# Patient Record
Sex: Female | Born: 1985 | Race: White | Hispanic: No | Marital: Married | Smoking: Never smoker
Health system: Southern US, Community
[De-identification: ages and names within clinical notes are randomized; demographics above are authoritative.]

## PROBLEM LIST (undated history)

## (undated) DIAGNOSIS — G5602 Carpal tunnel syndrome, left upper limb: Secondary | ICD-10-CM

## (undated) DIAGNOSIS — R112 Nausea with vomiting, unspecified: Secondary | ICD-10-CM

## (undated) DIAGNOSIS — K589 Irritable bowel syndrome without diarrhea: Secondary | ICD-10-CM

## (undated) DIAGNOSIS — Z9889 Other specified postprocedural states: Secondary | ICD-10-CM

## (undated) DIAGNOSIS — G4733 Obstructive sleep apnea (adult) (pediatric): Secondary | ICD-10-CM

## (undated) DIAGNOSIS — E282 Polycystic ovarian syndrome: Secondary | ICD-10-CM

## (undated) DIAGNOSIS — M2669 Other specified disorders of temporomandibular joint: Secondary | ICD-10-CM

## (undated) DIAGNOSIS — E538 Deficiency of other specified B group vitamins: Secondary | ICD-10-CM

## (undated) HISTORY — PX: TONSILLECTOMY: SUR1361

## (undated) HISTORY — PX: BREAST LUMPECTOMY: SHX2

## (undated) HISTORY — PX: CYST REMOVAL HAND: SHX6279

---

## 2013-09-04 ENCOUNTER — Ambulatory Visit: Payer: Self-pay | Admitting: Gastroenterology

## 2013-09-07 LAB — PATHOLOGY REPORT

## 2014-01-22 ENCOUNTER — Other Ambulatory Visit: Payer: Self-pay | Admitting: Orthopedic Surgery

## 2014-01-29 ENCOUNTER — Encounter (HOSPITAL_BASED_OUTPATIENT_CLINIC_OR_DEPARTMENT_OTHER): Payer: Self-pay | Admitting: *Deleted

## 2014-02-01 NOTE — Progress Notes (Signed)
Pt in for anesthesia consult - Dr. Sampson GoonFitzgerald examined airway - ok for surgery

## 2014-02-04 ENCOUNTER — Encounter (HOSPITAL_BASED_OUTPATIENT_CLINIC_OR_DEPARTMENT_OTHER): Payer: Self-pay | Admitting: Certified Registered"

## 2014-02-04 ENCOUNTER — Ambulatory Visit (HOSPITAL_BASED_OUTPATIENT_CLINIC_OR_DEPARTMENT_OTHER): Payer: 59 | Admitting: Certified Registered"

## 2014-02-04 ENCOUNTER — Encounter (HOSPITAL_BASED_OUTPATIENT_CLINIC_OR_DEPARTMENT_OTHER)
Admission: RE | Disposition: A | Discharge status: Home / Self Care | Payer: Self-pay | Source: Ambulatory Visit | Attending: Orthopedic Surgery

## 2014-02-04 ENCOUNTER — Ambulatory Visit (HOSPITAL_BASED_OUTPATIENT_CLINIC_OR_DEPARTMENT_OTHER)
Admission: RE | Admit: 2014-02-04 | Discharge: 2014-02-04 | Disposition: A | Discharge status: Home / Self Care | Payer: 59 | Source: Ambulatory Visit | Attending: Orthopedic Surgery | Admitting: Orthopedic Surgery

## 2014-02-04 DIAGNOSIS — G5601 Carpal tunnel syndrome, right upper limb: Secondary | ICD-10-CM | POA: Insufficient documentation

## 2014-02-04 DIAGNOSIS — K589 Irritable bowel syndrome without diarrhea: Secondary | ICD-10-CM | POA: Insufficient documentation

## 2014-02-04 HISTORY — DX: Other specified disorders of temporomandibular joint: M26.69

## 2014-02-04 HISTORY — DX: Nausea with vomiting, unspecified: R11.2

## 2014-02-04 HISTORY — DX: Other specified postprocedural states: Z98.890

## 2014-02-04 HISTORY — PX: CARPAL TUNNEL RELEASE: SHX101

## 2014-02-04 HISTORY — DX: Irritable bowel syndrome, unspecified: K58.9

## 2014-02-04 LAB — POCT HEMOGLOBIN-HEMACUE: Hemoglobin: 13.5 g/dL (ref 12.0–15.0)

## 2014-02-04 SURGERY — CARPAL TUNNEL RELEASE
Anesthesia: General | Site: Wrist | Laterality: Right

## 2014-02-04 MED ORDER — SCOPOLAMINE 1 MG/3DAYS TD PT72
MEDICATED_PATCH | TRANSDERMAL | Status: AC
  Filled 2014-02-04: qty 1

## 2014-02-04 MED ORDER — LIDOCAINE HCL (CARDIAC) 20 MG/ML IV SOLN
INTRAVENOUS | Status: DC | PRN
  Administered 2014-02-04: 60 mg via INTRAVENOUS

## 2014-02-04 MED ORDER — LACTATED RINGERS IV SOLN
INTRAVENOUS | Status: DC
  Administered 2014-02-04 (×2): via INTRAVENOUS

## 2014-02-04 MED ORDER — MIDAZOLAM HCL 2 MG/ML PO SYRP: 12.0000 mg | ORAL_SOLUTION | Freq: Once | ORAL | Status: DC | PRN

## 2014-02-04 MED ORDER — FENTANYL CITRATE 0.05 MG/ML IJ SOLN
INTRAMUSCULAR | Status: DC | PRN
  Administered 2014-02-04: 100 ug via INTRAVENOUS
  Administered 2014-02-04 (×2): 25 ug via INTRAVENOUS
  Administered 2014-02-04: 50 ug via INTRAVENOUS

## 2014-02-04 MED ORDER — CHLORHEXIDINE GLUCONATE 4 % EX LIQD: 60.0000 mL | Freq: Once | CUTANEOUS | Status: DC

## 2014-02-04 MED ORDER — FENTANYL CITRATE 0.05 MG/ML IJ SOLN
INTRAMUSCULAR | Status: AC
  Filled 2014-02-04: qty 4

## 2014-02-04 MED ORDER — SCOPOLAMINE 1 MG/3DAYS TD PT72
1.0000 | MEDICATED_PATCH | TRANSDERMAL | Status: DC
Start: 2014-02-04 — End: 2014-02-04
  Administered 2014-02-04: 1.5 mg via TRANSDERMAL

## 2014-02-04 MED ORDER — MIDAZOLAM HCL 5 MG/5ML IJ SOLN
INTRAMUSCULAR | Status: DC | PRN
  Administered 2014-02-04: 2 mg via INTRAVENOUS

## 2014-02-04 MED ORDER — DEXAMETHASONE SODIUM PHOSPHATE 10 MG/ML IJ SOLN
INTRAMUSCULAR | Status: DC | PRN
  Administered 2014-02-04: 10 mg via INTRAVENOUS

## 2014-02-04 MED ORDER — HYDROCODONE-ACETAMINOPHEN 5-325 MG PO TABS: ORAL_TABLET | ORAL | Status: DC

## 2014-02-04 MED ORDER — ONDANSETRON HCL 4 MG/2ML IJ SOLN: 4.0000 mg | Freq: Once | INTRAMUSCULAR | Status: DC | PRN

## 2014-02-04 MED ORDER — HYDROMORPHONE HCL 1 MG/ML IJ SOLN
0.2500 mg | INTRAMUSCULAR | Status: DC | PRN
  Administered 2014-02-04: 0.5 mg via INTRAVENOUS

## 2014-02-04 MED ORDER — PROPOFOL 10 MG/ML IV BOLUS
INTRAVENOUS | Status: DC | PRN
  Administered 2014-02-04: 200 mg via INTRAVENOUS

## 2014-02-04 MED ORDER — ONDANSETRON HCL 4 MG/2ML IJ SOLN
INTRAMUSCULAR | Status: DC | PRN
  Administered 2014-02-04: 4 mg via INTRAVENOUS

## 2014-02-04 MED ORDER — CEFAZOLIN SODIUM-DEXTROSE 2-3 GM-% IV SOLR
INTRAVENOUS | Status: AC
  Filled 2014-02-04: qty 50

## 2014-02-04 MED ORDER — BUPIVACAINE HCL (PF) 0.25 % IJ SOLN
INTRAMUSCULAR | Status: AC
  Filled 2014-02-04: qty 30

## 2014-02-04 MED ORDER — OXYCODONE HCL 5 MG/5ML PO SOLN: 5.0000 mg | Freq: Once | ORAL | Status: DC | PRN

## 2014-02-04 MED ORDER — MIDAZOLAM HCL 2 MG/2ML IJ SOLN
INTRAMUSCULAR | Status: AC
  Filled 2014-02-04: qty 2

## 2014-02-04 MED ORDER — CEFAZOLIN SODIUM 1-5 GM-% IV SOLN
INTRAVENOUS | Status: AC
  Filled 2014-02-04: qty 50

## 2014-02-04 MED ORDER — HYDROMORPHONE HCL 1 MG/ML IJ SOLN
INTRAMUSCULAR | Status: AC
  Filled 2014-02-04: qty 1

## 2014-02-04 MED ORDER — BUPIVACAINE HCL (PF) 0.25 % IJ SOLN
INTRAMUSCULAR | Status: DC | PRN
  Administered 2014-02-04: 9 mL

## 2014-02-04 MED ORDER — CEFAZOLIN SODIUM-DEXTROSE 2-3 GM-% IV SOLR
2.0000 g | INTRAVENOUS | Status: AC
  Administered 2014-02-04: 2 g via INTRAVENOUS

## 2014-02-04 MED ORDER — FENTANYL CITRATE 0.05 MG/ML IJ SOLN: 50.0000 ug | INTRAMUSCULAR | Status: DC | PRN

## 2014-02-04 MED ORDER — OXYCODONE HCL 5 MG PO TABS: 5.0000 mg | ORAL_TABLET | Freq: Once | ORAL | Status: DC | PRN

## 2014-02-04 MED ORDER — MIDAZOLAM HCL 2 MG/2ML IJ SOLN: 1.0000 mg | INTRAMUSCULAR | Status: DC | PRN

## 2014-02-04 SURGICAL SUPPLY — 38 items
BANDAGE ELASTIC 3 VELCRO ST LF (GAUZE/BANDAGES/DRESSINGS) ×3 IMPLANT
BLADE MINI RND TIP GREEN BEAV (BLADE) IMPLANT
BLADE SURG 15 STRL LF DISP TIS (BLADE) ×2 IMPLANT
BLADE SURG 15 STRL SS (BLADE) ×4
BNDG ESMARK 4X9 LF (GAUZE/BANDAGES/DRESSINGS) ×3 IMPLANT
BNDG GAUZE ELAST 4 BULKY (GAUZE/BANDAGES/DRESSINGS) ×3 IMPLANT
CHLORAPREP W/TINT 26ML (MISCELLANEOUS) ×3 IMPLANT
CORDS BIPOLAR (ELECTRODE) ×3 IMPLANT
COVER BACK TABLE 60X90IN (DRAPES) ×3 IMPLANT
COVER MAYO STAND STRL (DRAPES) ×3 IMPLANT
CUFF TOURNIQUET SINGLE 18IN (TOURNIQUET CUFF) IMPLANT
CUFF TOURNIQUET SINGLE 24IN (TOURNIQUET CUFF) ×3 IMPLANT
DRAPE EXTREMITY T 121X128X90 (DRAPE) ×3 IMPLANT
DRAPE SURG 17X23 STRL (DRAPES) ×3 IMPLANT
DRSG PAD ABDOMINAL 8X10 ST (GAUZE/BANDAGES/DRESSINGS) ×3 IMPLANT
GAUZE SPONGE 4X4 12PLY STRL (GAUZE/BANDAGES/DRESSINGS) ×3 IMPLANT
GAUZE XEROFORM 1X8 LF (GAUZE/BANDAGES/DRESSINGS) ×3 IMPLANT
GLOVE BIO SURGEON STRL SZ7.5 (GLOVE) ×3 IMPLANT
GLOVE BIOGEL PI IND STRL 6.5 (GLOVE) ×1 IMPLANT
GLOVE BIOGEL PI IND STRL 8 (GLOVE) ×1 IMPLANT
GLOVE BIOGEL PI INDICATOR 6.5 (GLOVE) ×2
GLOVE BIOGEL PI INDICATOR 8 (GLOVE) ×2
GLOVE ECLIPSE 6.5 STRL STRAW (GLOVE) ×3 IMPLANT
GLOVE EXAM NITRILE EXT CUFF MD (GLOVE) ×3 IMPLANT
GOWN STRL REUS W/ TWL LRG LVL3 (GOWN DISPOSABLE) ×1 IMPLANT
GOWN STRL REUS W/TWL LRG LVL3 (GOWN DISPOSABLE) ×2
GOWN STRL REUS W/TWL XL LVL3 (GOWN DISPOSABLE) ×3 IMPLANT
NEEDLE HYPO 25X1 1.5 SAFETY (NEEDLE) ×3 IMPLANT
NS IRRIG 1000ML POUR BTL (IV SOLUTION) ×3 IMPLANT
PACK BASIN DAY SURGERY FS (CUSTOM PROCEDURE TRAY) ×3 IMPLANT
PADDING CAST ABS 4INX4YD NS (CAST SUPPLIES) ×2
PADDING CAST ABS COTTON 4X4 ST (CAST SUPPLIES) ×1 IMPLANT
STOCKINETTE 4X48 STRL (DRAPES) ×3 IMPLANT
SUT ETHILON 4 0 PS 2 18 (SUTURE) ×3 IMPLANT
SYR BULB 3OZ (MISCELLANEOUS) ×3 IMPLANT
SYR CONTROL 10ML LL (SYRINGE) ×3 IMPLANT
TOWEL OR 17X24 6PK STRL BLUE (TOWEL DISPOSABLE) ×3 IMPLANT
UNDERPAD 30X30 INCONTINENT (UNDERPADS AND DIAPERS) ×3 IMPLANT

## 2014-02-04 NOTE — Op Note (Signed)
Misty West:  West, Misty West              ACCOUNT NO.:  [id number]  MEDICAL RECORD NO.:  [id number]  LOCATION:                                 FACILITY:  PHYSICIAN:  Betha LoaKevin Chanceler Pullin, MD             DATE OF BIRTH:  DATE OF PROCEDURE:  02/04/2014 DATE OF DISCHARGE:                              OPERATIVE REPORT   PREOPERATIVE DIAGNOSIS:  Right carpal tunnel syndrome.  POSTOPERATIVE DIAGNOSIS:  Right carpal tunnel syndrome.  PROCEDURE:  Right carpal tunnel release.  SURGEON:  Betha LoaKevin Mailani Degroote, MD  ASSISTANT:  None.  ANESTHESIA:  General.  IV FLUIDS:  Per anesthesia flow sheet.  ESTIMATED BLOOD LOSS:  Minimal.  COMPLICATIONS:  None.  SPECIMENS:  None.  TOURNIQUET TIME:  24 minutes.  DISPOSITION:  Stable to PACU.  INDICATIONS:  Ms. Misty MechBittner is a 28 year old female, who has had pins and needle sensation in the digits.  It is bothersome to her.  It wakes her at night, causing her to shake her hand again at relief.  She has positive nerve conduction studies.  She wished to have a right carpal tunnel release for management of symptoms.  Risks, benefits, and alternatives of the surgery were discussed including risk of blood loss, infection, damage to nerves, vessels, tendons, ligaments, bone; failure of surgery; need for additional surgery, complications with wound healing, continued pain, recurrent carpal tunnel syndrome and damage to motor branch.  She voiced understanding of these risks and elected to proceed.  OPERATIVE COURSE:  After being identified preoperatively by myself, the patient and I agreed upon procedure and site of procedure.  Surgical site was marked.  The risks, benefits, and alternatives of surgery were reviewed and she wished to proceed.  Surgical consent had been signed. She was given IV Ancef as preoperative antibiotic prophylaxis.  She was transported to the operating room and placed on the operating room table in a supine position with the right upper extremity on  arm board. General anesthesia was induced by Anesthesiology.  Right upper extremity was prepped and draped in normal sterile orthopedic fashion.  A surgical pause was performed between surgeons, anesthesia, and operating room staff, and all were in agreement as to the patient, procedure, and site of procedure.  Tourniquet at the proximal aspect of the extremity was inflated to 250 mmHg after exsanguination of the limb with Esmarch bandage.  Incision was made over the transverse carpal ligament and carried into subcutaneous tissues by spreading technique.  Bipolar electrocautery was used to obtain hemostasis.  The palmar fascia was sharply incised.  The transverse carpal ligament was identified and sharply incised.  It was incised distally first.  Care was taken to ensure complete decompression distally.  I was then incised proximally. Scissors were used to split the distal aspect of the volar antebrachial fascia.  The finger was placed into the wound to ensure complete decompression it was the case.  The nerve was inspected.  It was hyperemic.  There was what appeared to be a persistent median artery. The motor branch was identified and was intact.  The wound was copiously irrigated with sterile saline.  It was then closed with 4-0  nylon in a horizontal mattress fashion.  It was injected with 9 mL of 0.25% plain Marcaine to aid in postoperative analgesia.  It was then dressed with sterile Xeroform, 4x4s, and ABD and wrapped with Kerlix and Ace bandage. Tourniquet was deflated at 24 minutes.  Fingertips were pink with brisk capillary refill after deflation of the tourniquet.  Operative drapes were broken down.  The patient was awoken from anesthesia safely.  She was transferred back to stretcher and taken to PACU in stable condition. I will see her back in the office in 1 week for postoperative followup. I will give her Norco 5/325, 1-2 p.o. q.6 hours p.r.n. pain,  dispensed #30.     Betha LoaKevin Stanisha Lorenz, MD     KK/MEDQ  D:  02/04/2014  T:  02/04/2014  Job:  [id number]

## 2014-02-04 NOTE — H&P (Signed)
  Misty West is an 28 y.o. female.   Chief Complaint: right carpal tunnel syndrome HPI: 28 yo lhd female with numbness and tingling in bilateral hands.  She has nocturnal symptoms that wake her and cause her to shake her hand to get relief.  Positive nerve conduction studies.  She wishes to have a carpal tunnel release for management of symptoms.  Past Medical History  Diagnosis Date  . PONV (postoperative nausea and vomiting)   . Irritable bowel syndrome (IBS)     no current med.  . Carpal tunnel syndrome of right wrist 01/2014  . TMJ locking     Past Surgical History  Procedure Laterality Date  . Cesarean section    . Breast lumpectomy Right   . Tonsillectomy    . Cyst removal hand Right     History reviewed. No pertinent family history. Social History:  reports that she has never smoked. She has never used smokeless tobacco. She reports that she drinks alcohol. She reports that she does not use illicit drugs.  Allergies: No Known Allergies  Medications Prior to Admission  Medication Sig Dispense Refill  . etonogestrel (NEXPLANON) 68 MG IMPL implant 1 each by Subdermal route once.      Results for orders placed or performed during the hospital encounter of 02/04/14 (from the past 48 hour(s))  Hemoglobin-hemacue, POC     Status: None   Collection Time: 02/04/14  9:10 AM  Result Value Ref Range   Hemoglobin 13.5 12.0 - 15.0 g/dL    No results found.   A comprehensive review of systems was negative except for: Eyes: positive for contacts/glasses  Blood pressure 133/63, pulse 100, temperature 98.2 F (36.8 C), temperature source Oral, resp. rate 20, height 5\' 8"  (1.727 m), weight 135.626 kg (299 lb), SpO2 100 %.  General appearance: alert, cooperative and appears stated age Head: Normocephalic, without obvious abnormality, atraumatic Neck: supple, symmetrical, trachea midline Resp: clear to auscultation bilaterally Cardio: regular rate and rhythm GI: non  tender Extremities: intact sensation and capillary refill in fingertips.  +epl/fpl/io.  no wounds Pulses: 2+ and symmetric Skin: Skin color, texture, turgor normal. No rashes or lesions Neurologic: Grossly normal Incision/Wound: none  Assessment/Plan Right carpal tunnel syndrome.  Non operative and operative treatment options were discussed with the patient and patient wishes to proceed with operative treatment. Risks, benefits, and alternatives of surgery were discussed and the patient agrees with the plan of care.   Misty West R 02/04/2014, 10:02 AM

## 2014-02-04 NOTE — Anesthesia Preprocedure Evaluation (Addendum)
Anesthesia Evaluation  Patient identified by MRN, date of birth, ID band Patient awake    Reviewed: Allergy & Precautions, H&P , NPO status   History of Anesthesia Complications (+) PONV  Airway Mallampati: II  TM Distance: >3 FB Neck ROM: Full    Dental  (+) Teeth Intact, Dental Advisory Given   Pulmonary  breath sounds clear to auscultation        Cardiovascular Rhythm:Regular Rate:Normal     Neuro/Psych  Neuromuscular disease    GI/Hepatic   Endo/Other  Morbid obesity  Renal/GU      Musculoskeletal   Abdominal   Peds  Hematology   Anesthesia Other Findings   Reproductive/Obstetrics                            Anesthesia Physical Anesthesia Plan  ASA: II  Anesthesia Plan: General   Post-op Pain Management:    Induction: Intravenous  Airway Management Planned: LMA  Additional Equipment:   Intra-op Plan:   Post-operative Plan: Extubation in OR  Informed Consent: I have reviewed the patients History and Physical, chart, labs and discussed the procedure including the risks, benefits and alternatives for the proposed anesthesia with the patient or authorized representative who has indicated his/her understanding and acceptance.   Dental advisory given  Plan Discussed with:   Anesthesia Plan Comments: (Pt desires general. Poor experiences in the past with sedation.)        Anesthesia Quick Evaluation

## 2014-02-04 NOTE — Anesthesia Procedure Notes (Signed)
Procedure Name: LMA Insertion Date/Time: 02/04/2014 11:01 AM Performed by: Ellington Cornia Pre-anesthesia Checklist: Patient identified, Emergency Drugs available, Suction available and Patient being monitored Patient Re-evaluated:Patient Re-evaluated prior to inductionOxygen Delivery Method: Circle System Utilized Preoxygenation: Pre-oxygenation with 100% oxygen Intubation Type: IV induction Ventilation: Mask ventilation without difficulty LMA: LMA inserted LMA Size: 4.0 Number of attempts: 1 Airway Equipment and Method: bite block Placement Confirmation: positive ETCO2 Tube secured with: Tape Dental Injury: Teeth and Oropharynx as per pre-operative assessment

## 2014-02-04 NOTE — Op Note (Signed)
[id number] 

## 2014-02-04 NOTE — Brief Op Note (Signed)
02/04/2014  11:39 AM  PATIENT:  Misty West  28 y.o. female  PRE-OPERATIVE DIAGNOSIS:  right carpal tunnel syndrome  POST-OPERATIVE DIAGNOSIS:  right carpal tunnel syndrome  PROCEDURE:  Procedure(s): RIGHT CARPAL TUNNEL RELEASE (Right)  SURGEON:  Surgeon(s) and Role:    * Betha LoaKevin Valree Feild, MD - Primary  PHYSICIAN ASSISTANT:   ASSISTANTS: none   ANESTHESIA:   general  EBL:  Total I/O In: 1000 [I.V.:1000] Out: -   BLOOD ADMINISTERED:none  DRAINS: none   LOCAL MEDICATIONS USED:  MARCAINE     SPECIMEN:  No Specimen  DISPOSITION OF SPECIMEN:  N/A  COUNTS:  YES  TOURNIQUET:   Total Tourniquet Time Documented: Upper Arm (Right) - 24 minutes Total: Upper Arm (Right) - 24 minutes   DICTATION: .Other Dictation: Dictation Number [id number]  PLAN OF CARE: Discharge to home after PACU  PATIENT DISPOSITION:  PACU - hemodynamically stable.

## 2014-02-04 NOTE — Transfer of Care (Signed)
Immediate Anesthesia Transfer of Care Note  Patient: Misty BonusRachel L Grunden  Procedure(s) Performed: Procedure(s): RIGHT CARPAL TUNNEL RELEASE (Right)  Patient Location: PACU  Anesthesia Type:General  Level of Consciousness: awake, sedated and patient cooperative  Airway & Oxygen Therapy: Patient Spontanous Breathing and Patient connected to face mask oxygen  Post-op Assessment: Report given to PACU RN and Post -op Vital signs reviewed and stable  Post vital signs: Reviewed and stable  Complications: No apparent anesthesia complications

## 2014-02-04 NOTE — Anesthesia Postprocedure Evaluation (Signed)
  Anesthesia Post-op Note  Patient: Misty West  Procedure(s) Performed: Procedure(s): RIGHT CARPAL TUNNEL RELEASE (Right)  Patient Location: PACU  Anesthesia Type: General   Level of Consciousness: awake, alert  and oriented  Airway and Oxygen Therapy: Patient Spontanous Breathing  Post-op Pain: mild  Post-op Assessment: Post-op Vital signs reviewed  Post-op Vital Signs: Reviewed  Last Vitals:  Filed Vitals:   02/04/14 1200  BP: 139/83  Pulse: 87  Temp:   Resp: 18    Complications: No apparent anesthesia complications

## 2014-02-04 NOTE — Discharge Instructions (Addendum)

## 2014-02-05 ENCOUNTER — Encounter (HOSPITAL_BASED_OUTPATIENT_CLINIC_OR_DEPARTMENT_OTHER): Payer: Self-pay | Admitting: Orthopedic Surgery

## 2014-02-17 ENCOUNTER — Other Ambulatory Visit: Payer: Self-pay | Admitting: Orthopedic Surgery

## 2014-02-23 DIAGNOSIS — G5602 Carpal tunnel syndrome, left upper limb: Secondary | ICD-10-CM

## 2014-02-23 HISTORY — DX: Carpal tunnel syndrome, left upper limb: G56.02

## 2014-03-08 ENCOUNTER — Encounter (HOSPITAL_BASED_OUTPATIENT_CLINIC_OR_DEPARTMENT_OTHER): Payer: Self-pay | Admitting: *Deleted

## 2014-03-11 ENCOUNTER — Ambulatory Visit (HOSPITAL_BASED_OUTPATIENT_CLINIC_OR_DEPARTMENT_OTHER): Payer: 59 | Admitting: Certified Registered"

## 2014-03-11 ENCOUNTER — Encounter (HOSPITAL_BASED_OUTPATIENT_CLINIC_OR_DEPARTMENT_OTHER)
Admission: RE | Disposition: A | Discharge status: Home / Self Care | Payer: Self-pay | Source: Ambulatory Visit | Attending: Orthopedic Surgery

## 2014-03-11 ENCOUNTER — Ambulatory Visit (HOSPITAL_BASED_OUTPATIENT_CLINIC_OR_DEPARTMENT_OTHER)
Admission: RE | Admit: 2014-03-11 | Discharge: 2014-03-11 | Disposition: A | Discharge status: Home / Self Care | Payer: 59 | Source: Ambulatory Visit | Attending: Orthopedic Surgery | Admitting: Orthopedic Surgery

## 2014-03-11 ENCOUNTER — Encounter (HOSPITAL_BASED_OUTPATIENT_CLINIC_OR_DEPARTMENT_OTHER): Payer: Self-pay | Admitting: Certified Registered"

## 2014-03-11 DIAGNOSIS — K910 Vomiting following gastrointestinal surgery: Secondary | ICD-10-CM | POA: Insufficient documentation

## 2014-03-11 DIAGNOSIS — K589 Irritable bowel syndrome without diarrhea: Secondary | ICD-10-CM | POA: Diagnosis not present

## 2014-03-11 DIAGNOSIS — G5601 Carpal tunnel syndrome, right upper limb: Secondary | ICD-10-CM | POA: Insufficient documentation

## 2014-03-11 DIAGNOSIS — G5602 Carpal tunnel syndrome, left upper limb: Secondary | ICD-10-CM | POA: Insufficient documentation

## 2014-03-11 HISTORY — PX: CARPAL TUNNEL RELEASE: SHX101

## 2014-03-11 HISTORY — DX: Carpal tunnel syndrome, left upper limb: G56.02

## 2014-03-11 SURGERY — CARPAL TUNNEL RELEASE
Anesthesia: General | Site: Wrist | Laterality: Left

## 2014-03-11 MED ORDER — PROPOFOL 10 MG/ML IV BOLUS
INTRAVENOUS | Status: DC | PRN
  Administered 2014-03-11: 200 mg via INTRAVENOUS

## 2014-03-11 MED ORDER — CHLORHEXIDINE GLUCONATE 4 % EX LIQD: 60.0000 mL | Freq: Once | CUTANEOUS | Status: DC

## 2014-03-11 MED ORDER — ONDANSETRON HCL 4 MG/2ML IJ SOLN
INTRAMUSCULAR | Status: DC | PRN
  Administered 2014-03-11: 4 mg via INTRAVENOUS

## 2014-03-11 MED ORDER — DEXTROSE 5 % IV SOLN
3.0000 g | INTRAVENOUS | Status: AC
  Administered 2014-03-11: 3 g via INTRAVENOUS

## 2014-03-11 MED ORDER — CEFAZOLIN SODIUM 1-5 GM-% IV SOLN
INTRAVENOUS | Status: AC
  Filled 2014-03-11: qty 50

## 2014-03-11 MED ORDER — LACTATED RINGERS IV SOLN
INTRAVENOUS | Status: DC
  Administered 2014-03-11: 10:00:00 via INTRAVENOUS

## 2014-03-11 MED ORDER — MIDAZOLAM HCL 5 MG/5ML IJ SOLN
INTRAMUSCULAR | Status: DC | PRN
  Administered 2014-03-11: 2 mg via INTRAVENOUS

## 2014-03-11 MED ORDER — MIDAZOLAM HCL 2 MG/2ML IJ SOLN: 1.0000 mg | INTRAMUSCULAR | Status: DC | PRN

## 2014-03-11 MED ORDER — DEXAMETHASONE SODIUM PHOSPHATE 10 MG/ML IJ SOLN
INTRAMUSCULAR | Status: DC | PRN
  Administered 2014-03-11: 10 mg via INTRAVENOUS

## 2014-03-11 MED ORDER — MIDAZOLAM HCL 2 MG/ML PO SYRP: 12.0000 mg | ORAL_SOLUTION | Freq: Once | ORAL | Status: DC | PRN

## 2014-03-11 MED ORDER — FENTANYL CITRATE 0.05 MG/ML IJ SOLN: 50.0000 ug | INTRAMUSCULAR | Status: DC | PRN

## 2014-03-11 MED ORDER — FENTANYL CITRATE 0.05 MG/ML IJ SOLN
25.0000 ug | INTRAMUSCULAR | Status: DC | PRN
  Administered 2014-03-11 (×2): 50 ug via INTRAVENOUS

## 2014-03-11 MED ORDER — SCOPOLAMINE 1 MG/3DAYS TD PT72
MEDICATED_PATCH | TRANSDERMAL | Status: AC
  Filled 2014-03-11: qty 1

## 2014-03-11 MED ORDER — MIDAZOLAM HCL 2 MG/2ML IJ SOLN
INTRAMUSCULAR | Status: AC
  Filled 2014-03-11: qty 2

## 2014-03-11 MED ORDER — HYDROCODONE-ACETAMINOPHEN 5-325 MG PO TABS: ORAL_TABLET | ORAL | Status: DC

## 2014-03-11 MED ORDER — SCOPOLAMINE 1 MG/3DAYS TD PT72
MEDICATED_PATCH | TRANSDERMAL | Status: DC | PRN
  Administered 2014-03-11: 1 via TRANSDERMAL

## 2014-03-11 MED ORDER — PROPOFOL 10 MG/ML IV BOLUS
INTRAVENOUS | Status: AC
  Filled 2014-03-11: qty 20

## 2014-03-11 MED ORDER — BUPIVACAINE HCL (PF) 0.25 % IJ SOLN
INTRAMUSCULAR | Status: DC | PRN
  Administered 2014-03-11: 10 mL

## 2014-03-11 MED ORDER — OXYCODONE-ACETAMINOPHEN 5-325 MG PO TABS: ORAL_TABLET | ORAL | Status: DC

## 2014-03-11 MED ORDER — FENTANYL CITRATE 0.05 MG/ML IJ SOLN
INTRAMUSCULAR | Status: DC | PRN
  Administered 2014-03-11 (×2): 50 ug via INTRAVENOUS

## 2014-03-11 MED ORDER — SCOPOLAMINE 1 MG/3DAYS TD PT72: 1.0000 | MEDICATED_PATCH | TRANSDERMAL | Status: DC

## 2014-03-11 MED ORDER — FENTANYL CITRATE 0.05 MG/ML IJ SOLN
INTRAMUSCULAR | Status: AC
  Filled 2014-03-11: qty 4

## 2014-03-11 MED ORDER — LIDOCAINE HCL (CARDIAC) 20 MG/ML IV SOLN
INTRAVENOUS | Status: DC | PRN
  Administered 2014-03-11: 60 mg via INTRAVENOUS

## 2014-03-11 MED ORDER — FENTANYL CITRATE 0.05 MG/ML IJ SOLN
INTRAMUSCULAR | Status: AC
  Filled 2014-03-11: qty 2

## 2014-03-11 MED ORDER — ONDANSETRON HCL 4 MG/2ML IJ SOLN: 4.0000 mg | Freq: Four times a day (QID) | INTRAMUSCULAR | Status: DC | PRN

## 2014-03-11 SURGICAL SUPPLY — 36 items
BANDAGE ELASTIC 3 VELCRO ST LF (GAUZE/BANDAGES/DRESSINGS) ×3 IMPLANT
BLADE MINI RND TIP GREEN BEAV (BLADE) IMPLANT
BLADE SURG 15 STRL LF DISP TIS (BLADE) ×2 IMPLANT
BLADE SURG 15 STRL SS (BLADE) ×4
BNDG ESMARK 4X9 LF (GAUZE/BANDAGES/DRESSINGS) ×3 IMPLANT
BNDG GAUZE ELAST 4 BULKY (GAUZE/BANDAGES/DRESSINGS) ×3 IMPLANT
CHLORAPREP W/TINT 26ML (MISCELLANEOUS) ×3 IMPLANT
CORDS BIPOLAR (ELECTRODE) ×3 IMPLANT
COVER BACK TABLE 60X90IN (DRAPES) ×3 IMPLANT
COVER MAYO STAND STRL (DRAPES) ×3 IMPLANT
CUFF TOURNIQUET SINGLE 18IN (TOURNIQUET CUFF) IMPLANT
CUFF TOURNIQUET SINGLE 24IN (TOURNIQUET CUFF) ×3 IMPLANT
DRAPE EXTREMITY T 121X128X90 (DRAPE) ×3 IMPLANT
DRAPE SURG 17X23 STRL (DRAPES) ×3 IMPLANT
DRSG PAD ABDOMINAL 8X10 ST (GAUZE/BANDAGES/DRESSINGS) ×3 IMPLANT
GAUZE SPONGE 4X4 12PLY STRL (GAUZE/BANDAGES/DRESSINGS) ×3 IMPLANT
GAUZE XEROFORM 1X8 LF (GAUZE/BANDAGES/DRESSINGS) ×3 IMPLANT
GLOVE BIO SURGEON STRL SZ7.5 (GLOVE) ×3 IMPLANT
GLOVE BIOGEL PI IND STRL 8 (GLOVE) ×1 IMPLANT
GLOVE BIOGEL PI INDICATOR 8 (GLOVE) ×2
GLOVE ECLIPSE 6.5 STRL STRAW (GLOVE) ×3 IMPLANT
GLOVE EXAM NITRILE LRG STRL (GLOVE) ×3 IMPLANT
GOWN STRL REUS W/ TWL LRG LVL3 (GOWN DISPOSABLE) ×1 IMPLANT
GOWN STRL REUS W/TWL LRG LVL3 (GOWN DISPOSABLE) ×2
GOWN STRL REUS W/TWL XL LVL3 (GOWN DISPOSABLE) ×3 IMPLANT
NEEDLE HYPO 25X1 1.5 SAFETY (NEEDLE) ×3 IMPLANT
NS IRRIG 1000ML POUR BTL (IV SOLUTION) ×3 IMPLANT
PACK BASIN DAY SURGERY FS (CUSTOM PROCEDURE TRAY) ×3 IMPLANT
PADDING CAST ABS 4INX4YD NS (CAST SUPPLIES) ×2
PADDING CAST ABS COTTON 4X4 ST (CAST SUPPLIES) ×1 IMPLANT
STOCKINETTE 4X48 STRL (DRAPES) ×3 IMPLANT
SUT ETHILON 4 0 PS 2 18 (SUTURE) ×3 IMPLANT
SYR BULB 3OZ (MISCELLANEOUS) ×3 IMPLANT
SYR CONTROL 10ML LL (SYRINGE) ×3 IMPLANT
TOWEL OR 17X24 6PK STRL BLUE (TOWEL DISPOSABLE) ×3 IMPLANT
UNDERPAD 30X30 INCONTINENT (UNDERPADS AND DIAPERS) IMPLANT

## 2014-03-11 NOTE — Discharge Instructions (Addendum)

## 2014-03-11 NOTE — H&P (Signed)
  Misty West is an 28 y.o. female.   Chief Complaint: left carpal tunnel syndrome HPI: 28 yo lhd female with numbness and tingling of bilateral hands.  Nocturnal symptoms nightly that wake her.  Positive nerve conduction studies.  She has had a right carpal tunnel release with good relief and wishes to have a left carpal tunnel release.  Past Medical History  Diagnosis Date  . PONV (postoperative nausea and vomiting)   . Irritable bowel syndrome (IBS)     no current med.  . TMJ locking   . Carpal tunnel syndrome on left 02/2014    Past Surgical History  Procedure Laterality Date  . Cesarean section    . Breast lumpectomy Right   . Tonsillectomy    . Cyst removal hand Right   . Carpal tunnel release Right 02/04/2014    Procedure: RIGHT CARPAL TUNNEL RELEASE;  Surgeon: Betha LoaKevin Colisha Redler, MD;  Location: [facility];  Service: Orthopedics;  Laterality: Right;    History reviewed. No pertinent family history. Social History:  reports that she has never smoked. She has never used smokeless tobacco. She reports that she drinks alcohol. She reports that she does not use illicit drugs.  Allergies: No Known Allergies  Medications Prior to Admission  Medication Sig Dispense Refill  . etonogestrel (NEXPLANON) 68 MG IMPL implant 1 each by Subdermal route once.    . fluticasone (FLONASE) 50 MCG/ACT nasal spray Place into both nostrils daily.      No results found for this or any previous visit (from the past 48 hour(s)).  No results found.   A comprehensive review of systems was negative except for: Eyes: positive for contacts/glasses  Height 5\' 8"  (1.727 m), weight 136.079 kg (300 lb).  General appearance: alert, cooperative and appears stated age Head: Normocephalic, without obvious abnormality, atraumatic Neck: supple, symmetrical, trachea midline Resp: clear to auscultation bilaterally Cardio: regular rate and rhythm GI: non tender Extremities: intact sensation  and capillary refill all digits.  +epl/fpl/io.  no wounds.  Pulses: 2+ and symmetric Skin: Skin color, texture, turgor normal. No rashes or lesions Neurologic: Grossly normal Incision/Wound: none  Assessment/Plan Left carpal tunnel syndrome.  Non operative and operative treatment options were discussed with the patient and patient wishes to proceed with operative treatment. Risks, benefits, and alternatives of surgery were discussed and the patient agrees with the plan of care.   Misty West R 03/11/2014, 8:22 AM

## 2014-03-11 NOTE — Transfer of Care (Signed)
Immediate Anesthesia Transfer of Care Note  Patient: Misty West  Procedure(s) Performed: Procedure(s): LEFT CARPAL TUNNEL RELEASE (Left)  Patient Location: PACU  Anesthesia Type:General  Level of Consciousness: awake, alert  and patient cooperative  Airway & Oxygen Therapy: Patient Spontanous Breathing and Patient connected to face mask oxygen  Post-op Assessment: Report given to PACU RN, Post -op Vital signs reviewed and stable and Patient moving all extremities  Post vital signs: Reviewed and stable  Complications: No apparent anesthesia complications

## 2014-03-11 NOTE — Anesthesia Preprocedure Evaluation (Signed)
Anesthesia Evaluation  Patient identified by MRN, date of birth, ID band Patient awake    Reviewed: Allergy & Precautions, H&P , NPO status , Patient's Chart, lab work & pertinent test results  History of Anesthesia Complications (+) PONV  Airway Mallampati: II   Neck ROM: full    Dental   Pulmonary          Cardiovascular negative cardio ROS      Neuro/Psych  Neuromuscular disease    GI/Hepatic   Endo/Other  Morbid obesity  Renal/GU      Musculoskeletal   Abdominal   Peds  Hematology   Anesthesia Other Findings   Reproductive/Obstetrics                             Anesthesia Physical Anesthesia Plan  ASA: II  Anesthesia Plan: General   Post-op Pain Management:    Induction: Intravenous  Airway Management Planned: LMA  Additional Equipment:   Intra-op Plan:   Post-operative Plan:   Informed Consent: I have reviewed the patients History and Physical, chart, labs and discussed the procedure including the risks, benefits and alternatives for the proposed anesthesia with the patient or authorized representative who has indicated his/her understanding and acceptance.     Plan Discussed with: CRNA, Anesthesiologist and Surgeon  Anesthesia Plan Comments:         Anesthesia Quick Evaluation

## 2014-03-11 NOTE — Anesthesia Procedure Notes (Signed)
Procedure Name: LMA Insertion Date/Time: 03/11/2014 9:46 AM Performed by: Curly ShoresRAFT, Mairead Schwarzkopf W Pre-anesthesia Checklist: Patient identified, Emergency Drugs available, Suction available and Patient being monitored Patient Re-evaluated:Patient Re-evaluated prior to inductionOxygen Delivery Method: Circle System Utilized Preoxygenation: Pre-oxygenation with 100% oxygen Intubation Type: IV induction Ventilation: Mask ventilation without difficulty LMA: LMA inserted LMA Size: 4.0 Number of attempts: 1 Airway Equipment and Method: bite block Placement Confirmation: positive ETCO2 and breath sounds checked- equal and bilateral Tube secured with: Tape Dental Injury: Teeth and Oropharynx as per pre-operative assessment

## 2014-03-11 NOTE — Op Note (Signed)
[id number] 

## 2014-03-11 NOTE — Anesthesia Postprocedure Evaluation (Signed)
Anesthesia Post Note  Patient: Misty West  Procedure(s) Performed: Procedure(s) (LRB): LEFT CARPAL TUNNEL RELEASE (Left)  Anesthesia type: General  Patient location: PACU  Post pain: Pain level controlled and Adequate analgesia  Post assessment: Post-op Vital signs reviewed, Patient's Cardiovascular Status Stable, Respiratory Function Stable, Patent Airway and Pain level controlled  Last Vitals:  Filed Vitals:   03/11/14 1030  BP: 122/50  Pulse: 93  Temp:   Resp: 19    Post vital signs: Reviewed and stable  Level of consciousness: awake, alert  and oriented  Complications: No apparent anesthesia complications

## 2014-03-11 NOTE — Op Note (Signed)
NAMKarmen Bongo:  Weaver, Miliana              ACCOUNT NO.:  [id number]  MEDICAL RECORD NO.:  [id number]  LOCATION:                                 FACILITY:  PHYSICIAN:  Betha LoaKevin Lidia Clavijo, MD             DATE OF BIRTH:  DATE OF PROCEDURE:  03/11/2014 DATE OF DISCHARGE:                              OPERATIVE REPORT   PREOPERATIVE DIAGNOSIS:  Left carpal tunnel syndrome.  POSTOPERATIVE DIAGNOSIS:  Left carpal tunnel syndrome.  PROCEDURE:  Left carpal tunnel release.  SURGEON:  Betha LoaKevin Estefany Goebel, MD  ASSISTANT:  None.  ANESTHESIA:  General.  IV FLUIDS:  Per anesthesia flow sheet.  ESTIMATED BLOOD LOSS:  Minimal.  COMPLICATIONS:  None.  SPECIMENS:  None.  TOURNIQUET TIME:  13 minutes.  DISPOSITION:  Stable to PACU.  INDICATIONS:  [name] is a 28 year old female with who has had pins and needle sensation in the left hand.  She has positive nerve conduction studies.  She has had right carpal tunnel release and was happy with the results.  She wishes to have left carpal tunnel release. Risks, benefits and alternatives of the surgery were discussed including the risk of blood loss; infection; damage to nerves, vessels, tendons, ligaments, bone; failure of surgery; need for additional surgery; complications with wound healing; continued pain; recurrence of carpal tunnel syndrome; and damage to motor branch.  She voiced understanding of these risks and elected to proceed.  OPERATIVE COURSE:  After being identified preoperatively by myself, the patient and I agreed upon the procedure and site of procedure.  The surgical site was marked.  The risks, benefits, and alternatives of the surgery were reviewed and she wished to proceed.  Surgical consent had been signed.  She was given IV Ancef as preoperative antibiotic prophylaxis.  She was transferred to the operating room and placed on the operating room table in supine position with the left upper extremity on an armboard.  General anesthesia  was induced by anesthesiologist.  Left upper extremity was prepped and draped in normal sterile orthopedic fashion.  A surgical pause was performed between the surgeons, anesthesia, and operating room staff, and all were in agreement as to the patient, procedure and site of procedure. Tourniquet at the proximal aspect of the extremity was inflated to 250 mmHg after exsanguination of the limb with an Esmarch bandage.  Incision was made over the transverse carpal ligament.  It was carried down to the subcutaneous tissues by spreading technique.  Bipolar electrocautery was used to obtain hemostasis.  The palmar fascia was sharply incised. Transverse carpal ligament was identified.  It was incised sharply.  It was incised distally first.  Care was taken to ensure complete decompression distally.  It was incised proximally.  Scissors were used to split the distal aspect of the volar antebrachial fascia.  Finger was placed into the wound to ensure complete decompression, which was the case.  The nerve was inspected.  The motor branch was identified and was intact.  The nerve was adhered to the radial leaflet and flattened.  The wound was copiously irrigated with sterile saline.  It was then closed with 4-0 nylon in a horizontal  mattress fashion.  It was injected with 10 mL of 0.25% plain Marcaine to aid in postoperative analgesia.  It was then dressed with sterile Xeroform, 4x4s, and ABD and wrapped with Kerlix and Ace bandage.  Tourniquet was deflated at 13 minutes. Fingertips were pink with brisk capillary refill after deflation of the tourniquet.  Operative drapes were broken down.  The patient was awakened from anesthesia safely.  She was transferred back to the stretcher and taken to the PACU in stable condition.  I will see her back in the office in 1 week for postoperative followup.  I will give her Norco 5/325, 1-2 p.o. q.6 hours p.r.n. pain, dispensed #30.     Betha LoaKevin Chenise Mulvihill,  MD     KK/MEDQ  D:  03/11/2014  T:  03/11/2014  Job:  [id number]

## 2014-03-11 NOTE — Brief Op Note (Signed)
03/11/2014  10:08 AM  PATIENT:  Judie Bonusachel L Bartley  28 y.o. female  PRE-OPERATIVE DIAGNOSIS:  LEFT CARPAL TUNNEL SYNDROME  POST-OPERATIVE DIAGNOSIS:  LEFT CARPAL TUNNEL SYNDROME  PROCEDURE:  Procedure(s): LEFT CARPAL TUNNEL RELEASE (Left)  SURGEON:  Surgeon(s) and Role:    * Betha LoaKevin Macari Zalesky, MD - Primary  PHYSICIAN ASSISTANT:   ASSISTANTS: none   ANESTHESIA:   general  EBL:  Total I/O In: 706 [I.V.:706] Out: -   BLOOD ADMINISTERED:none  DRAINS: none   LOCAL MEDICATIONS USED:  MARCAINE     SPECIMEN:  No Specimen  DISPOSITION OF SPECIMEN:  N/A  COUNTS:  YES  TOURNIQUET:   Total Tourniquet Time Documented: Upper Arm (Left) - 13 minutes Total: Upper Arm (Left) - 13 minutes   DICTATION: .Other Dictation: Dictation Number [id number]  PLAN OF CARE: Discharge to home after PACU  PATIENT DISPOSITION:  PACU - hemodynamically stable.

## 2015-04-08 ENCOUNTER — Ambulatory Visit: Payer: 59 | Attending: Internal Medicine

## 2015-04-29 ENCOUNTER — Ambulatory Visit: Payer: 59 | Attending: Internal Medicine

## 2015-04-29 DIAGNOSIS — R0683 Snoring: Secondary | ICD-10-CM | POA: Insufficient documentation

## 2015-04-29 DIAGNOSIS — G4710 Hypersomnia, unspecified: Secondary | ICD-10-CM | POA: Diagnosis present

## 2015-04-29 DIAGNOSIS — G4733 Obstructive sleep apnea (adult) (pediatric): Secondary | ICD-10-CM | POA: Insufficient documentation

## 2015-09-19 ENCOUNTER — Other Ambulatory Visit: Payer: Self-pay | Admitting: Internal Medicine

## 2015-09-19 DIAGNOSIS — R591 Generalized enlarged lymph nodes: Secondary | ICD-10-CM

## 2015-09-26 ENCOUNTER — Ambulatory Visit
Admission: RE | Admit: 2015-09-26 | Discharge: 2015-09-26 | Disposition: A | Discharge status: Home / Self Care | Payer: 59 | Source: Ambulatory Visit | Attending: Internal Medicine | Admitting: Internal Medicine

## 2015-09-26 DIAGNOSIS — R591 Generalized enlarged lymph nodes: Secondary | ICD-10-CM | POA: Diagnosis present

## 2015-09-26 MED ORDER — IOPAMIDOL (ISOVUE-300) INJECTION 61%
75.0000 mL | Freq: Once | INTRAVENOUS | Status: AC | PRN
Start: 2015-09-26 — End: 2015-09-26
  Administered 2015-09-26: 75 mL via INTRAVENOUS

## 2015-10-06 ENCOUNTER — Emergency Department
Admission: EM | Admit: 2015-10-06 | Discharge: 2015-10-06 | Disposition: A | Discharge status: Home / Self Care | Payer: 59 | Attending: Emergency Medicine | Admitting: Emergency Medicine

## 2015-10-06 ENCOUNTER — Emergency Department: Payer: 59

## 2015-10-06 DIAGNOSIS — R1032 Left lower quadrant pain: Secondary | ICD-10-CM | POA: Diagnosis present

## 2015-10-06 DIAGNOSIS — R109 Unspecified abdominal pain: Secondary | ICD-10-CM

## 2015-10-06 LAB — COMPREHENSIVE METABOLIC PANEL
ALBUMIN: 4.2 g/dL (ref 3.5–5.0)
ALK PHOS: 60 U/L (ref 38–126)
ALT: 69 U/L — ABNORMAL HIGH (ref 14–54)
ANION GAP: 7 (ref 5–15)
AST: 39 U/L (ref 15–41)
BUN: 12 mg/dL (ref 6–20)
CALCIUM: 9.3 mg/dL (ref 8.9–10.3)
CO2: 24 mmol/L (ref 22–32)
Chloride: 105 mmol/L (ref 101–111)
Creatinine, Ser: 0.66 mg/dL (ref 0.44–1.00)
GFR calc Af Amer: >60 mL/min (ref >60)
Glucose, Bld: 99 mg/dL (ref 65–99)
POTASSIUM: 3.8 mmol/L (ref 3.5–5.1)
Sodium: 136 mmol/L (ref 135–145)
TOTAL PROTEIN: 7.3 g/dL (ref 6.5–8.1)
Total Bilirubin: 0.3 mg/dL (ref 0.3–1.2)

## 2015-10-06 LAB — CBC WITH DIFFERENTIAL/PLATELET
BASOS ABS: 0.1 10*3/uL (ref 0–0.1)
Basophils Relative: 1 %
Eosinophils Absolute: 0.1 10*3/uL (ref 0–0.7)
Eosinophils Relative: 1 %
HCT: 39.6 % (ref 35.0–47.0)
Hemoglobin: 13 g/dL (ref 12.0–16.0)
LYMPHS PCT: 25 %
Lymphs Abs: 2.5 10*3/uL (ref 1.0–3.6)
MCH: 26.8 pg (ref 26.0–34.0)
MCHC: 32.7 g/dL (ref 32.0–36.0)
MCV: 81.8 fL (ref 80.0–100.0)
Monocytes Absolute: 0.7 10*3/uL (ref 0.2–0.9)
Monocytes Relative: 7 %
Neutro Abs: 6.8 10*3/uL — ABNORMAL HIGH (ref 1.4–6.5)
Neutrophils Relative %: 66 %
Platelets: 315 10*3/uL (ref 150–440)
RBC: 4.84 MIL/uL (ref 3.80–5.20)
RDW: 14.3 % (ref 11.5–14.5)
WBC: 10.1 10*3/uL (ref 3.6–11.0)

## 2015-10-06 LAB — URINALYSIS COMPLETE WITH MICROSCOPIC (ARMC ONLY)
Bacteria, UA: NONE SEEN
Bilirubin Urine: NEGATIVE
Glucose, UA: NEGATIVE mg/dL
Hgb urine dipstick: NEGATIVE
KETONES UR: NEGATIVE mg/dL
Leukocytes, UA: NEGATIVE
Nitrite: NEGATIVE
PROTEIN: NEGATIVE mg/dL
RBC / HPF: NONE SEEN RBC/hpf (ref 0–5)
Specific Gravity, Urine: 1.006 (ref 1.005–1.030)
pH: 7 (ref 5.0–8.0)

## 2015-10-06 LAB — POCT PREGNANCY, URINE: Preg Test, Ur: NEGATIVE

## 2015-10-06 LAB — MONONUCLEOSIS SCREEN: MONO SCREEN: NEGATIVE

## 2015-10-06 LAB — LIPASE, BLOOD: LIPASE: 23 U/L (ref 11–51)

## 2015-10-06 MED ORDER — DICYCLOMINE HCL 20 MG PO TABS: 20.0000 mg | ORAL_TABLET | Freq: Three times a day (TID) | ORAL | Status: AC | PRN

## 2015-10-06 NOTE — ED Notes (Signed)
Pt reports shes had bloating/ sharp pains in her sides, also 3 missed periods. Reports frequent urination. Reports neg pregnancy test yesterday at OB/GYN and was given progesterone pills to induce period. States she feels a large "lump" in her abd

## 2015-10-06 NOTE — Discharge Instructions (Signed)
Abdominal Pain, Adult °Many things can cause abdominal pain. Usually, abdominal pain is not caused by a disease and will improve without treatment. It can often be observed and treated at home. Your health care provider will do a physical exam and possibly order blood tests and X-rays to help determine the seriousness of your pain. However, in many cases, more time must pass before a clear cause of the pain can be found. Before that point, your health care provider may not know if you need more testing or further treatment. °HOME CARE INSTRUCTIONS °Monitor your abdominal pain for any changes. The following actions may help to alleviate any discomfort you are experiencing: °· Only take over-the-counter or prescription medicines as directed by your health care provider. °· Do not take laxatives unless directed to do so by your health care provider. °· Try a clear liquid diet (broth, tea, or water) as directed by your health care provider. Slowly move to a bland diet as tolerated. °SEEK MEDICAL CARE IF: °· You have unexplained abdominal pain. °· You have abdominal pain associated with nausea or diarrhea. °· You have pain when you urinate or have a bowel movement. °· You experience abdominal pain that wakes you in the night. °· You have abdominal pain that is worsened or improved by eating food. °· You have abdominal pain that is worsened with eating fatty foods. °· You have a fever. °SEEK IMMEDIATE MEDICAL CARE IF: °· Your pain does not go away within 2 hours. °· You keep throwing up (vomiting). °· Your pain is felt only in portions of the abdomen, such as the right side or the left lower portion of the abdomen. °· You pass bloody or black tarry stools. °MAKE SURE YOU: °· Understand these instructions. °· Will watch your condition. °· Will get help right away if you are not doing well or get worse. °  °This information is not intended to replace advice given to you by your health care provider. Make sure you discuss  any questions you have with your health care provider. °  °Document Released: 12/20/2004 Document Revised: 12/01/2014 Document Reviewed: 11/19/2012 °Elsevier Interactive Patient Education ©2016 [employer] ° ° °Please return immediately if condition worsens. Please contact her primary physician or the physician you were given for referral. If you have any specialist physicians involved in her treatment and plan please also contact them. Thank you for using [facility]. ° °

## 2015-10-06 NOTE — ED Provider Notes (Signed)
Time Seen: Approximately *1900 I have reviewed the triage notes  Chief Complaint: Abdominal Pain   History of Present Illness: Misty West is a 30 y.o. female who presents with a 2 week history of some diffuse abdominal pain. She states the pain is migratory and intermittent in nature. She states she noticed it more toward the right side of her abdomen and then now looks toward the left lower quadrant. She states she mentioned it to her OB/GYN who she saw yesterday due to missing her last menstrual periods. She states that they plan on doing some hormone treatment to initiate her menstrual periods. She states they plan on doing a ultrasound after she starts having some vaginal bleeding. She states that she's had some frequent urination but denies any dysuria or hematuria. She denies any back or flank pain. She states her discomfort started before she was initiated on progesterone pills. She denies any nausea, vomiting, fever. She states she has history of irritable bowel syndrome and states normally her stools are loose and she had a normal bowel movement earlier today without any melena or hematochezia diarrhea constipation Past Medical History  Diagnosis Date  . PONV (postoperative nausea and vomiting)   . Irritable bowel syndrome (IBS)     no current med.  . TMJ locking   . Carpal tunnel syndrome on left 02/2014    There are no active problems to display for this patient.   Past Surgical History  Procedure Laterality Date  . Cesarean section    . Breast lumpectomy Right   . Tonsillectomy    . Cyst removal hand Right   . Carpal tunnel release Right 02/04/2014    Procedure: RIGHT CARPAL TUNNEL RELEASE;  Surgeon: Betha LoaKevin Kuzma, MD;  Location: [facility];  Service: Orthopedics;  Laterality: Right;  . Carpal tunnel release Left 03/11/2014    Procedure: LEFT CARPAL TUNNEL RELEASE;  Surgeon: Betha LoaKevin Kuzma, MD;  Location: [facility];  Service: Orthopedics;   Laterality: Left;    Past Surgical History  Procedure Laterality Date  . Cesarean section    . Breast lumpectomy Right   . Tonsillectomy    . Cyst removal hand Right   . Carpal tunnel release Right 02/04/2014    Procedure: RIGHT CARPAL TUNNEL RELEASE;  Surgeon: Betha LoaKevin Kuzma, MD;  Location: [facility];  Service: Orthopedics;  Laterality: Right;  . Carpal tunnel release Left 03/11/2014    Procedure: LEFT CARPAL TUNNEL RELEASE;  Surgeon: Betha LoaKevin Kuzma, MD;  Location: [facility];  Service: Orthopedics;  Laterality: Left;    Current Outpatient Rx  Name  Route  Sig  Dispense  Refill  . etonogestrel (NEXPLANON) 68 MG IMPL implant   Subdermal   1 each by Subdermal route once.         . fluticasone (FLONASE) 50 MCG/ACT nasal spray   Each Nare   Place into both nostrils daily.         Marland Kitchen. oxyCODONE-acetaminophen (PERCOCET) 5-325 MG per tablet      1-2 tabs po q6 hours prn pain   30 tablet   0     Allergies:  Review of patient's allergies indicates no known allergies.  Family History: No family history on file.  Social History: Social History  Substance Use Topics  . Smoking status: Never Smoker   . Smokeless tobacco: Never Used  . Alcohol Use: Yes     Comment: 2-3 x/week     Review of  Systems:   10 point review of systems was performed and was otherwise negative:  Constitutional: No fever Eyes: No visual disturbances ENT: No sore throat, ear pain Cardiac: No chest pain Respiratory: No shortness of breath, wheezing, or stridor Abdomen: Diffuse intermittent now currently left lower quadrant Endocrine: No weight loss, No night sweats Extremities: No peripheral edema, cyanosis Skin: No rashes, easy bruising Neurologic: No focal weakness, trouble with speech or swollowing Urologic: No dysuria, Hematuria, or urinary frequency   Physical Exam:  ED Triage Vitals  Enc Vitals Group     BP 10/06/15 1852 133/75 mmHg     Pulse Rate 10/06/15  1852 105     Resp 10/06/15 1852 16     Temp 10/06/15 1852 98.7 F (37.1 C)     Temp Source 10/06/15 1852 Oral     SpO2 10/06/15 1852 100 %     Weight --      Height --      Head Cir --      Peak Flow --      Pain Score 10/06/15 1848 6     Pain Loc --      Pain Edu? --      Excl. in GC? --     General: Awake , Alert , and Oriented times 3; GCS 15 Head: Normal cephalic , atraumatic Eyes: Pupils equal , round, reactive to light Nose/Throat: No nasal drainage, patent upper airway without erythema or exudate.  Neck: Supple, Full range of motion, No anterior adenopathy or palpable thyroid masses Lungs: Clear to ascultation without wheezes , rhonchi, or rales Heart: Regular rate, regular rhythm without murmurs , gallops , or rubs Abdomen: Mild tenderness to the left lower quadrant without any rebound guarding or rigidity       Extremities: 2 plus symmetric pulses. No edema, clubbing or cyanosis Neurologic: normal ambulation, Motor symmetric without deficits, sensory intact Skin: warm, dry, no rashes   Labs:   All laboratory work was reviewed including any pertinent negatives or positives listed below:  Labs Reviewed  COMPREHENSIVE METABOLIC PANEL  CBC WITH DIFFERENTIAL/PLATELET  LIPASE, BLOOD  URINALYSIS COMPLETEWITH MICROSCOPIC ([facility] ONLY)  POCT PREGNANCY, URINE  Laboratory work was reviewed and showed no clinically significant abnormalities.   E Radiology:    CT RENAL STONE STUDY (Final result) Result time: 10/06/15 19:51:42   Final result by Rad Results In Interface (10/06/15 19:51:42)   Narrative:   CLINICAL DATA: Left upper quadrant and left lower quadrant pain and bloating for several weeks. Urinary frequency. Negative pregnancy test.  EXAM: CT ABDOMEN AND PELVIS WITHOUT CONTRAST  TECHNIQUE: Multidetector CT imaging of the abdomen and pelvis was performed following the standard protocol without IV contrast.  COMPARISON: None.  FINDINGS: Lower chest:  No acute findings.  Hepatobiliary: No mass visualized on this un-enhanced exam. Gallbladder is unremarkable.  Pancreas: No mass or inflammatory process identified on this un-enhanced exam.  Spleen: Within normal limits in size.  Adrenals/Urinary Tract: No evidence of urolithiasis or hydronephrosis. Unopacified urinary bladder is unremarkable in appearance.  Stomach/Bowel: No evidence of obstruction, inflammatory process, or abnormal fluid collections. Normal appendix visualized.  Vascular/Lymphatic: No pathologically enlarged lymph nodes. No evidence of abdominal aortic aneurysm.  Reproductive: Uterus and ovaries are unremarkable. No mass or other significant abnormality.  Other: None.  Musculoskeletal: No suspicious bone lesions identified.  IMPRESSION: No acute findings or other significant abnormality identified.   Electronically Signed By: Myles Rosenthal M.D. On: 10/06/2015 19:51  I personally reviewed the radiologic studies    ED Course: Patient's stay here was uneventful and she was evaluated for possible surgical abdomen versus ovarian cyst, etc. Her clinical presentation seemed to be more bowel related and she is has what appears to be a normal abdominal CT with normal laboratory work. The patient was advised continue the workup with her OB/GYN for irregular menstrual periods and that she does not appear to have at least a very large ovarian cyst or evidence of ovarian torsion on her CAT scan at this time. She also does not appear to have renal colic, acute appendicitis, diverticulitis, etc.   Assessment:  Acute unspecified abdominal pain   Final Clinical Impression:   Final diagnoses:  Left sided abdominal pain     Plan:  Outpatient management Prescription for Bentyl Patient was advised to return immediately if condition worsens. Patient was advised to follow up with their primary care physician or other specialized physicians  involved in their outpatient care. The patient and/or family member/power of attorney had laboratory results reviewed at the bedside. All questions and concerns were addressed and appropriate discharge instructions were distributed by the nursing staff.             Jennye Moccasin, MD 10/06/15 2116

## 2015-10-06 NOTE — ED Notes (Signed)
Patient had to urinate before triage so she was given a cup to collect it.

## 2016-02-09 ENCOUNTER — Encounter: Payer: Self-pay | Admitting: *Deleted

## 2016-02-10 ENCOUNTER — Ambulatory Visit: Payer: 59 | Admitting: Certified Registered Nurse Anesthetist

## 2016-02-10 ENCOUNTER — Encounter: Payer: Self-pay | Admitting: Certified Registered Nurse Anesthetist

## 2016-02-10 ENCOUNTER — Encounter
Admission: RE | Disposition: A | Discharge status: Home / Self Care | Payer: Self-pay | Source: Ambulatory Visit | Attending: Unknown Physician Specialty

## 2016-02-10 ENCOUNTER — Ambulatory Visit
Admission: RE | Admit: 2016-02-10 | Discharge: 2016-02-10 | Disposition: A | Discharge status: Home / Self Care | Payer: 59 | Source: Ambulatory Visit | Attending: Unknown Physician Specialty | Admitting: Unknown Physician Specialty

## 2016-02-10 DIAGNOSIS — Z7984 Long term (current) use of oral hypoglycemic drugs: Secondary | ICD-10-CM | POA: Diagnosis not present

## 2016-02-10 DIAGNOSIS — R1013 Epigastric pain: Secondary | ICD-10-CM | POA: Diagnosis present

## 2016-02-10 DIAGNOSIS — Z79899 Other long term (current) drug therapy: Secondary | ICD-10-CM | POA: Insufficient documentation

## 2016-02-10 DIAGNOSIS — Z9989 Dependence on other enabling machines and devices: Secondary | ICD-10-CM | POA: Diagnosis not present

## 2016-02-10 DIAGNOSIS — K3189 Other diseases of stomach and duodenum: Secondary | ICD-10-CM | POA: Diagnosis not present

## 2016-02-10 DIAGNOSIS — G4733 Obstructive sleep apnea (adult) (pediatric): Secondary | ICD-10-CM | POA: Diagnosis not present

## 2016-02-10 DIAGNOSIS — Z6841 Body Mass Index (BMI) 40.0 and over, adult: Secondary | ICD-10-CM | POA: Insufficient documentation

## 2016-02-10 DIAGNOSIS — K589 Irritable bowel syndrome without diarrhea: Secondary | ICD-10-CM | POA: Diagnosis not present

## 2016-02-10 DIAGNOSIS — K2950 Unspecified chronic gastritis without bleeding: Secondary | ICD-10-CM | POA: Insufficient documentation

## 2016-02-10 HISTORY — DX: Deficiency of other specified B group vitamins: E53.8

## 2016-02-10 HISTORY — DX: Polycystic ovarian syndrome: E28.2

## 2016-02-10 HISTORY — DX: Obstructive sleep apnea (adult) (pediatric): G47.33

## 2016-02-10 HISTORY — PX: ESOPHAGOGASTRODUODENOSCOPY (EGD) WITH PROPOFOL: SHX5813

## 2016-02-10 LAB — POCT PREGNANCY, URINE: PREG TEST UR: NEGATIVE

## 2016-02-10 SURGERY — ESOPHAGOGASTRODUODENOSCOPY (EGD) WITH PROPOFOL
Anesthesia: General

## 2016-02-10 MED ORDER — MIDAZOLAM HCL 2 MG/2ML IJ SOLN
INTRAMUSCULAR | Status: DC | PRN
  Administered 2016-02-10: 1 mg via INTRAVENOUS

## 2016-02-10 MED ORDER — SODIUM CHLORIDE 0.9 % IV SOLN
INTRAVENOUS | Status: DC
  Administered 2016-02-10: 09:00:00 via INTRAVENOUS

## 2016-02-10 MED ORDER — SODIUM CHLORIDE 0.9 % IV SOLN: INTRAVENOUS | Status: DC

## 2016-02-10 MED ORDER — LIDOCAINE HCL (CARDIAC) 20 MG/ML IV SOLN
INTRAVENOUS | Status: DC | PRN
  Administered 2016-02-10: 30 mg via INTRAVENOUS

## 2016-02-10 MED ORDER — PROPOFOL 500 MG/50ML IV EMUL
INTRAVENOUS | Status: DC | PRN
  Administered 2016-02-10: 140 ug/kg/min via INTRAVENOUS

## 2016-02-10 MED ORDER — PROPOFOL 10 MG/ML IV BOLUS
INTRAVENOUS | Status: DC | PRN
  Administered 2016-02-10: 40 mg via INTRAVENOUS
  Administered 2016-02-10 (×2): 30 mg via INTRAVENOUS
  Administered 2016-02-10: 50 mg via INTRAVENOUS

## 2016-02-10 MED ORDER — GLYCOPYRROLATE 0.2 MG/ML IJ SOLN
INTRAMUSCULAR | Status: DC | PRN
  Administered 2016-02-10: 0.2 mg via INTRAVENOUS

## 2016-02-10 NOTE — Transfer of Care (Signed)
Immediate Anesthesia Transfer of Care Note  Patient: Misty BonusRachel L Eckels  Procedure(s) Performed: Procedure(s): ESOPHAGOGASTRODUODENOSCOPY (EGD) WITH PROPOFOL (N/A)  Patient Location: PACU  Anesthesia Type:General  Level of Consciousness: awake and alert   Airway & Oxygen Therapy: Patient Spontanous Breathing and Patient connected to nasal cannula oxygen  Post-op Assessment: Report given to RN and Post -op Vital signs reviewed and stable  Post vital signs: Reviewed and stable  Last Vitals:  Vitals:   02/10/16 0830 02/10/16 0915  BP: 135/84 (!) 97/49  Pulse: 97 84  Resp: 19 18  Temp: 36.6 C 36.6 C    Last Pain:  Vitals:   02/10/16 0915  TempSrc: Tympanic         Complications: No apparent anesthesia complications

## 2016-02-10 NOTE — Anesthesia Preprocedure Evaluation (Signed)
Anesthesia Evaluation  Patient identified by MRN, date of birth, ID band Patient awake    Reviewed: Allergy & Precautions, NPO status , Patient's Chart, lab work & pertinent test results  History of Anesthesia Complications (+) PONV  Airway Mallampati: I       Dental   Pulmonary sleep apnea and Continuous Positive Airway Pressure Ventilation ,           Cardiovascular negative cardio ROS       Neuro/Psych negative neurological ROS     GI/Hepatic negative GI ROS, Neg liver ROS,   Endo/Other  negative endocrine ROSMorbid obesity  Renal/GU negative Renal ROS     Musculoskeletal negative musculoskeletal ROS (+)   Abdominal   Peds  Hematology negative hematology ROS (+)   Anesthesia Other Findings   Reproductive/Obstetrics                             Anesthesia Physical Anesthesia Plan  ASA: II  Anesthesia Plan: General   Post-op Pain Management:    Induction: Intravenous  Airway Management Planned: Nasal Cannula  Additional Equipment:   Intra-op Plan:   Post-operative Plan:   Informed Consent: I have reviewed the patients History and Physical, chart, labs and discussed the procedure including the risks, benefits and alternatives for the proposed anesthesia with the patient or authorized representative who has indicated his/her understanding and acceptance.     Plan Discussed with:   Anesthesia Plan Comments:         Anesthesia Quick Evaluation

## 2016-02-10 NOTE — Anesthesia Postprocedure Evaluation (Signed)
Anesthesia Post Note  Patient: Misty BonusRachel L Gillean  Procedure(s) Performed: Procedure(s) (LRB): ESOPHAGOGASTRODUODENOSCOPY (EGD) WITH PROPOFOL (N/A)  Patient location during evaluation: Endoscopy Anesthesia Type: General Level of consciousness: awake and alert Pain management: pain level controlled Vital Signs Assessment: post-procedure vital signs reviewed and stable Respiratory status: spontaneous breathing and respiratory function stable Cardiovascular status: stable Anesthetic complications: no    Last Vitals:  Vitals:   02/10/16 0925 02/10/16 0935  BP: 113/85 115/78  Pulse: 96 79  Resp: (!) 25 14  Temp:      Last Pain:  Vitals:   02/10/16 0915  TempSrc: Tympanic                 Marilyn Nihiser K

## 2016-02-10 NOTE — Anesthesia Procedure Notes (Signed)
Date/Time: 02/10/2016 8:55 AM Performed by: Ginger CarneMICHELET, Theoren Palka Pre-anesthesia Checklist: Patient identified, Emergency Drugs available, Suction available and Patient being monitored Patient Re-evaluated:Patient Re-evaluated prior to inductionOxygen Delivery Method: Nasal cannula

## 2016-02-10 NOTE — Op Note (Signed)
[facility] Gastroenterology Patient Name: Misty BongoRachel Booze Procedure Date: 02/10/2016 8:53 AM MRN: [id number] Account #: [id number] Date of Birth: 06-Dec-1985 Admit Type: Outpatient Age: [age] [facility] Gender: Female Note Status: Finalized Procedure:            Upper GI endoscopy Indications:          Epigastric abdominal pain Providers:            Scot Junobert T. Elliott, MD Referring MD:         Marya AmslerMarshall W. Dareen PianoAnderson MD, MD (Referring MD) Medicines:            Propofol per Anesthesia Complications:        No immediate complications. Procedure:            Pre-Anesthesia Assessment:                       - After reviewing the risks and benefits, the patient                        was deemed in satisfactory condition to undergo the                        procedure.                       After obtaining informed consent, the endoscope was                        passed under direct vision. Throughout the procedure,                        the patient's blood pressure, pulse, and oxygen                        saturations were monitored continuously. The Endoscope                        was introduced through the mouth, and advanced to the                        second part of duodenum. The upper GI endoscopy was                        accomplished without difficulty. The patient tolerated                        the procedure well. Findings:      The examined esophagus was normal.      Localized mildly erythematous mucosa without bleeding was found at the       gastroesophageal junction.      Diffuse mildly erythematous mucosa without bleeding was found in the       gastric antrum. Biopsies were taken with a cold forceps for histology.       Biopsies were taken with a cold forceps for Helicobacter pylori testing.       Biopsy done of antrum and body.      Diffuse mild mucosal changes characterized by granularity were found in       the duodenal bulb. Biopsies were  taken with a cold forceps for       histology. Biopsies for histology  were taken with a cold forceps for       evaluation of possible celiac disease.      The second portion of the duodenum was normal. Biopsies were taken with       a cold forceps for histology.      The Z-line was irregular and was found 40 cm from the incisors. Impression:           - Normal esophagus.                       - Erythematous mucosa in the gastroesophageal junction.                       - Erythematous mucosa in the antrum. Biopsied.                       - Mucosal changes in the duodenum. Biopsied.                       - Normal second portion of the duodenum. Biopsied. Recommendation:       - Await pathology results. Take medicine. Scot Junobert T Elliott, MD 02/10/2016 9:14:06 AM This report has been signed electronically. Number of Addenda: 0 Note Initiated On: 02/10/2016 8:53 AM      [facility]

## 2016-02-10 NOTE — H&P (Signed)
Primary Care Physician:  Kirk Ruths., MD Primary Gastroenterologist:  Dr. Vira Agar  Pre-Procedure History & Physical: HPI:  Misty West is a 30 y.o. female is here for an endoscopy.   Past Medical History:  Diagnosis Date  . Carpal tunnel syndrome on left 02/2014  . Irritable bowel syndrome (IBS)    no current med.  . Obstructive sleep apnea   . Polycystic ovarian syndrome   . PONV (postoperative nausea and vomiting)   . TMJ locking   . Vitamin B12 deficiency     Past Surgical History:  Procedure Laterality Date  . BREAST LUMPECTOMY Right   . CARPAL TUNNEL RELEASE Right 02/04/2014   Procedure: RIGHT CARPAL TUNNEL RELEASE;  Surgeon: Leanora Cover, MD;  Location: [place];  Service: Orthopedics;  Laterality: Right;  . CARPAL TUNNEL RELEASE Left 03/11/2014   Procedure: LEFT CARPAL TUNNEL RELEASE;  Surgeon: Leanora Cover, MD;  Location: [place];  Service: Orthopedics;  Laterality: Left;  . CESAREAN SECTION    . CYST REMOVAL HAND Right   . TONSILLECTOMY      Prior to Admission medications   Medication Sig Start Date End Date Taking? Authorizing Provider  colestipol (COLESTID) 1 g tablet Take 2 g by mouth daily.   Yes Historical Provider, MD  Cyanocobalamin 1000 MCG/ML KIT Inject 1,000 mcg as directed every 30 (thirty) days.   Yes Historical Provider, MD  metFORMIN (GLUCOPHAGE) 500 MG tablet Take 500 mg by mouth 2 (two) times daily with a meal.   Yes Historical Provider, MD  Prenatal Vit-Fe Fumarate-FA (PRENAPLUS PO) Take 1 tablet by mouth daily.   Yes Historical Provider, MD  dicyclomine (BENTYL) 20 MG tablet Take 1 tablet (20 mg total) by mouth 3 (three) times daily as needed for spasms. 10/06/15   Daymon Larsen, MD  etonogestrel (NEXPLANON) 68 MG IMPL implant 1 each by Subdermal route once.    Historical Provider, MD    Allergies as of 12/15/2015  . (No Known Allergies)    Family History  Problem Relation Age of Onset  .  Colon polyps Mother   . Colon cancer Maternal Grandfather     Social History   Social History  . Marital status: Married    Spouse name: N/A  . Number of children: N/A  . Years of education: N/A   Occupational History  . Not on file.   Social History Main Topics  . Smoking status: Never Smoker  . Smokeless tobacco: Never Used  . Alcohol use Yes     Comment: 2-3 x/week  . Drug use: No  . Sexual activity: Yes   Other Topics Concern  . Not on file   Social History Narrative  . No narrative on file    Review of Systems: See HPI, otherwise negative ROS  Physical Exam: BP 135/84   Pulse 97   Temp 97.8 F (36.6 C) (Tympanic)   Resp 19   Ht 5' 8" (1.727 m)   Wt 133.8 kg (295 lb)   SpO2 100%   BMI 44.85 kg/m  General:   Alert,  pleasant and cooperative in NAD Head:  Normocephalic and atraumatic. Neck:  Supple; no masses or thyromegaly. Lungs:  Clear throughout to auscultation.    Heart:  Regular rate and rhythm. Abdomen:  Soft, nontender and nondistended. Normal bowel sounds, without guarding, and without rebound.   Neurologic:  Alert and  oriented x4;  grossly normal neurologically.  Impression/Plan: Misty West is here  for an endoscopy to be performed for epigastric abd pain, right and left epigastric area.  Risks, benefits, limitations, and alternatives regarding  endoscopy have been reviewed with the patient.  Questions have been answered.  All parties agreeable.   Gaylyn Cheers, MD  02/10/2016, 8:52 AM

## 2016-02-13 ENCOUNTER — Encounter: Payer: Self-pay | Admitting: Unknown Physician Specialty

## 2016-02-14 LAB — SURGICAL PATHOLOGY

## 2016-03-28 DIAGNOSIS — E538 Deficiency of other specified B group vitamins: Secondary | ICD-10-CM | POA: Diagnosis not present

## 2016-04-02 DIAGNOSIS — G4733 Obstructive sleep apnea (adult) (pediatric): Secondary | ICD-10-CM | POA: Diagnosis not present

## 2016-04-03 DIAGNOSIS — N6312 Unspecified lump in the right breast, upper inner quadrant: Secondary | ICD-10-CM | POA: Diagnosis not present

## 2016-04-04 DIAGNOSIS — G4733 Obstructive sleep apnea (adult) (pediatric): Secondary | ICD-10-CM | POA: Diagnosis not present

## 2016-04-04 DIAGNOSIS — Z0000 Encounter for general adult medical examination without abnormal findings: Secondary | ICD-10-CM | POA: Diagnosis not present

## 2016-04-05 ENCOUNTER — Other Ambulatory Visit: Payer: Self-pay | Admitting: Obstetrics and Gynecology

## 2016-04-05 DIAGNOSIS — N63 Unspecified lump in unspecified breast: Secondary | ICD-10-CM

## 2016-04-11 ENCOUNTER — Other Ambulatory Visit: Payer: 59

## 2016-04-18 ENCOUNTER — Other Ambulatory Visit: Payer: Self-pay | Admitting: Obstetrics and Gynecology

## 2016-04-18 ENCOUNTER — Ambulatory Visit
Admission: RE | Admit: 2016-04-18 | Discharge: 2016-04-18 | Disposition: A | Discharge status: Home / Self Care | Payer: 59 | Source: Ambulatory Visit | Attending: Obstetrics and Gynecology | Admitting: Obstetrics and Gynecology

## 2016-04-18 DIAGNOSIS — R928 Other abnormal and inconclusive findings on diagnostic imaging of breast: Secondary | ICD-10-CM | POA: Diagnosis not present

## 2016-04-18 DIAGNOSIS — N63 Unspecified lump in unspecified breast: Secondary | ICD-10-CM

## 2016-04-18 DIAGNOSIS — N6489 Other specified disorders of breast: Secondary | ICD-10-CM | POA: Diagnosis not present

## 2016-04-20 DIAGNOSIS — Z0000 Encounter for general adult medical examination without abnormal findings: Secondary | ICD-10-CM | POA: Diagnosis not present

## 2016-04-20 DIAGNOSIS — R5383 Other fatigue: Secondary | ICD-10-CM | POA: Diagnosis not present

## 2016-04-30 DIAGNOSIS — K580 Irritable bowel syndrome with diarrhea: Secondary | ICD-10-CM | POA: Diagnosis not present

## 2016-04-30 DIAGNOSIS — K2950 Unspecified chronic gastritis without bleeding: Secondary | ICD-10-CM | POA: Diagnosis not present

## 2016-05-09 DIAGNOSIS — E538 Deficiency of other specified B group vitamins: Secondary | ICD-10-CM | POA: Diagnosis not present

## 2016-06-13 DIAGNOSIS — E538 Deficiency of other specified B group vitamins: Secondary | ICD-10-CM | POA: Diagnosis not present

## 2016-07-04 DIAGNOSIS — G4733 Obstructive sleep apnea (adult) (pediatric): Secondary | ICD-10-CM | POA: Diagnosis not present

## 2016-07-16 DIAGNOSIS — E538 Deficiency of other specified B group vitamins: Secondary | ICD-10-CM | POA: Diagnosis not present

## 2016-08-17 DIAGNOSIS — E538 Deficiency of other specified B group vitamins: Secondary | ICD-10-CM | POA: Diagnosis not present

## 2016-09-17 DIAGNOSIS — E538 Deficiency of other specified B group vitamins: Secondary | ICD-10-CM | POA: Diagnosis not present

## 2016-09-21 DIAGNOSIS — G8929 Other chronic pain: Secondary | ICD-10-CM | POA: Diagnosis not present

## 2016-09-21 DIAGNOSIS — M545 Low back pain: Secondary | ICD-10-CM | POA: Diagnosis not present

## 2016-09-21 DIAGNOSIS — R079 Chest pain, unspecified: Secondary | ICD-10-CM | POA: Diagnosis not present

## 2016-10-05 DIAGNOSIS — R102 Pelvic and perineal pain: Secondary | ICD-10-CM | POA: Diagnosis not present

## 2016-10-05 DIAGNOSIS — N915 Oligomenorrhea, unspecified: Secondary | ICD-10-CM | POA: Diagnosis not present

## 2016-10-05 DIAGNOSIS — R350 Frequency of micturition: Secondary | ICD-10-CM | POA: Diagnosis not present

## 2016-10-05 DIAGNOSIS — Z01419 Encounter for gynecological examination (general) (routine) without abnormal findings: Secondary | ICD-10-CM | POA: Diagnosis not present

## 2016-10-19 DIAGNOSIS — E538 Deficiency of other specified B group vitamins: Secondary | ICD-10-CM | POA: Diagnosis not present

## 2016-11-09 DIAGNOSIS — M545 Low back pain: Secondary | ICD-10-CM | POA: Diagnosis not present

## 2016-11-09 DIAGNOSIS — R1032 Left lower quadrant pain: Secondary | ICD-10-CM | POA: Diagnosis not present

## 2016-11-20 DIAGNOSIS — M5137 Other intervertebral disc degeneration, lumbosacral region: Secondary | ICD-10-CM | POA: Diagnosis not present

## 2016-11-20 DIAGNOSIS — M545 Low back pain: Secondary | ICD-10-CM | POA: Diagnosis not present

## 2016-11-23 DIAGNOSIS — E538 Deficiency of other specified B group vitamins: Secondary | ICD-10-CM | POA: Diagnosis not present

## 2016-11-28 DIAGNOSIS — M5440 Lumbago with sciatica, unspecified side: Secondary | ICD-10-CM | POA: Diagnosis not present

## 2016-11-28 DIAGNOSIS — M6281 Muscle weakness (generalized): Secondary | ICD-10-CM | POA: Diagnosis not present

## 2016-12-14 DIAGNOSIS — M5440 Lumbago with sciatica, unspecified side: Secondary | ICD-10-CM | POA: Diagnosis not present

## 2016-12-14 DIAGNOSIS — M6281 Muscle weakness (generalized): Secondary | ICD-10-CM | POA: Diagnosis not present

## 2016-12-25 DIAGNOSIS — G4733 Obstructive sleep apnea (adult) (pediatric): Secondary | ICD-10-CM | POA: Diagnosis not present

## 2016-12-28 DIAGNOSIS — E538 Deficiency of other specified B group vitamins: Secondary | ICD-10-CM | POA: Diagnosis not present

## 2016-12-31 DIAGNOSIS — M5137 Other intervertebral disc degeneration, lumbosacral region: Secondary | ICD-10-CM | POA: Diagnosis not present

## 2016-12-31 DIAGNOSIS — M545 Low back pain: Secondary | ICD-10-CM | POA: Diagnosis not present

## 2016-12-31 DIAGNOSIS — M5440 Lumbago with sciatica, unspecified side: Secondary | ICD-10-CM | POA: Diagnosis not present

## 2017-01-16 ENCOUNTER — Other Ambulatory Visit: Payer: Self-pay | Admitting: Orthopedic Surgery

## 2017-01-16 DIAGNOSIS — M545 Low back pain: Secondary | ICD-10-CM

## 2017-01-23 ENCOUNTER — Ambulatory Visit
Admission: RE | Admit: 2017-01-23 | Discharge: 2017-01-23 | Disposition: A | Discharge status: Home / Self Care | Payer: 59 | Source: Ambulatory Visit | Attending: Orthopedic Surgery | Admitting: Orthopedic Surgery

## 2017-01-23 DIAGNOSIS — M545 Low back pain: Secondary | ICD-10-CM | POA: Diagnosis present

## 2017-01-23 DIAGNOSIS — M5126 Other intervertebral disc displacement, lumbar region: Secondary | ICD-10-CM | POA: Insufficient documentation

## 2017-01-23 DIAGNOSIS — M48061 Spinal stenosis, lumbar region without neurogenic claudication: Secondary | ICD-10-CM | POA: Diagnosis not present

## 2017-01-23 DIAGNOSIS — M5127 Other intervertebral disc displacement, lumbosacral region: Secondary | ICD-10-CM | POA: Insufficient documentation

## 2017-02-01 DIAGNOSIS — E538 Deficiency of other specified B group vitamins: Secondary | ICD-10-CM | POA: Diagnosis not present

## 2017-03-01 DIAGNOSIS — M5126 Other intervertebral disc displacement, lumbar region: Secondary | ICD-10-CM | POA: Diagnosis not present

## 2017-03-01 DIAGNOSIS — M5416 Radiculopathy, lumbar region: Secondary | ICD-10-CM | POA: Diagnosis not present

## 2017-03-06 DIAGNOSIS — E538 Deficiency of other specified B group vitamins: Secondary | ICD-10-CM | POA: Diagnosis not present

## 2017-03-13 DIAGNOSIS — M62838 Other muscle spasm: Secondary | ICD-10-CM | POA: Diagnosis not present

## 2017-04-03 DIAGNOSIS — R51 Headache: Secondary | ICD-10-CM | POA: Diagnosis not present

## 2017-04-10 DIAGNOSIS — E538 Deficiency of other specified B group vitamins: Secondary | ICD-10-CM | POA: Diagnosis not present

## 2017-04-12 DIAGNOSIS — M5416 Radiculopathy, lumbar region: Secondary | ICD-10-CM | POA: Diagnosis not present

## 2017-04-12 DIAGNOSIS — M5126 Other intervertebral disc displacement, lumbar region: Secondary | ICD-10-CM | POA: Diagnosis not present

## 2017-05-08 DIAGNOSIS — M5137 Other intervertebral disc degeneration, lumbosacral region: Secondary | ICD-10-CM | POA: Diagnosis not present

## 2017-05-08 DIAGNOSIS — M545 Low back pain: Secondary | ICD-10-CM | POA: Diagnosis not present

## 2017-05-21 IMAGING — CT CT RENAL STONE PROTOCOL
3 of 4 series · 8 of 46 positions shown, 15 images · non-contrast
Comparison: None.

CLINICAL DATA: Left upper quadrant and left lower quadrant pain and
bloating for several weeks. Urinary frequency. Negative pregnancy
test.

EXAM:
CT ABDOMEN AND PELVIS WITHOUT CONTRAST
TECHNIQUE: Multidetector CT imaging of the abdomen and pelvis was performed
following the standard protocol without IV contrast.

[Series 4: lung · axial · 0.85mm/px · z∈[-158,-88]mm · 4 of 24 slices shown, 9 images]
[im 5/24  soft-tissue]
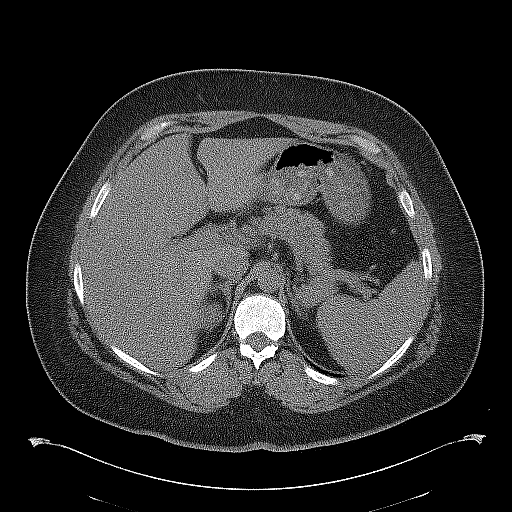
[im 5/24  lung]
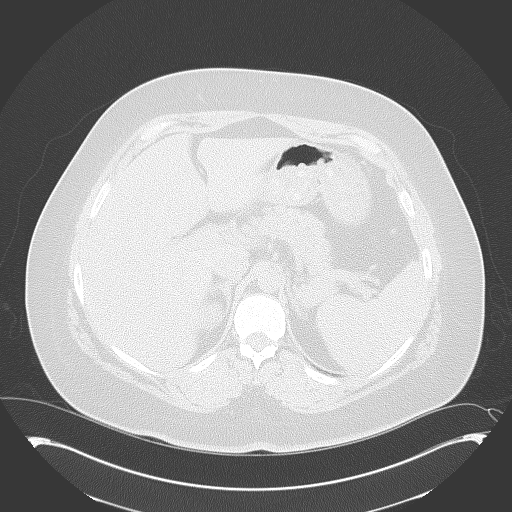
[im 5/24  bone]
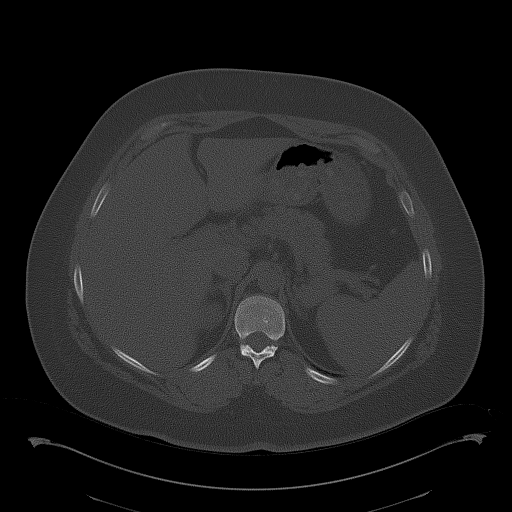
[im 10/24  soft-tissue]
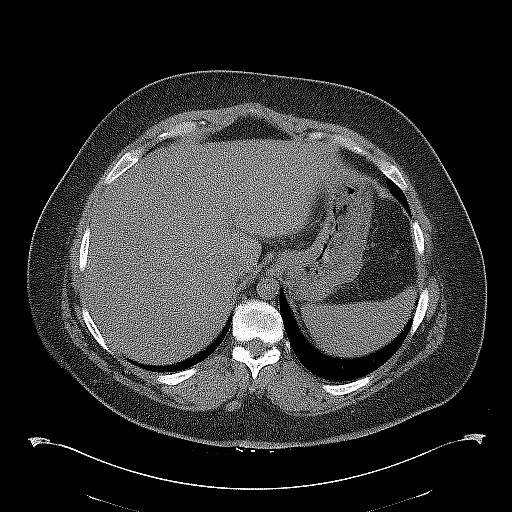
[im 10/24  lung]
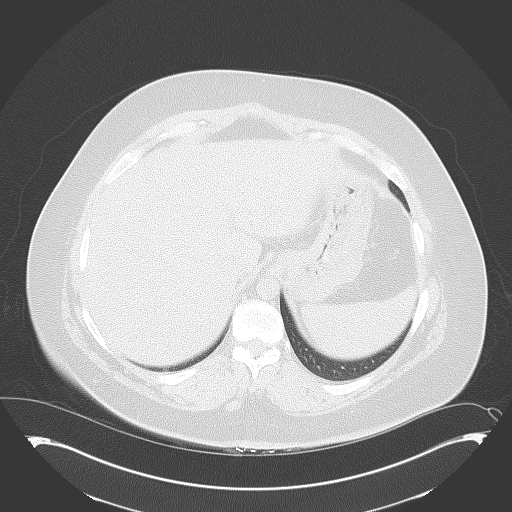
[im 14/24  soft-tissue]
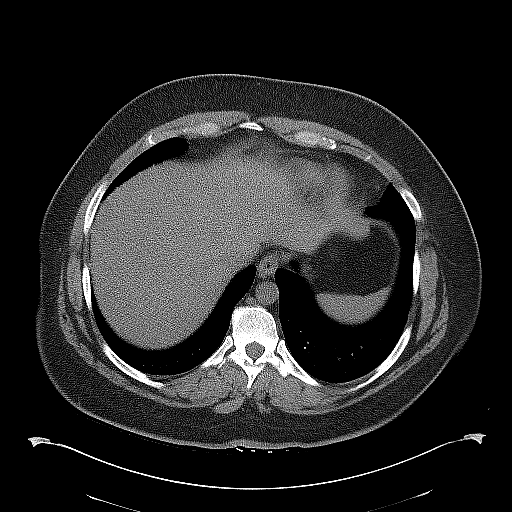
[im 14/24  lung]
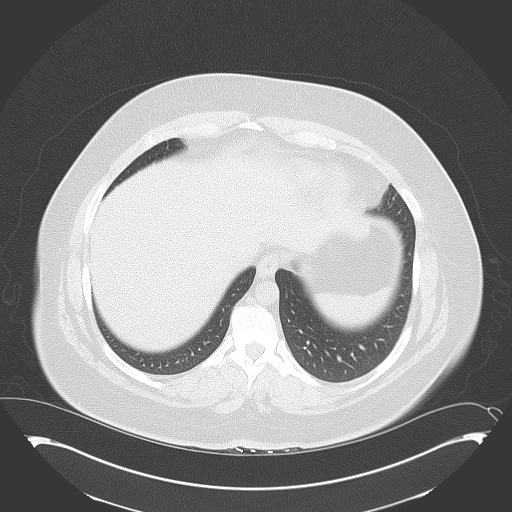
[im 19/24  soft-tissue]
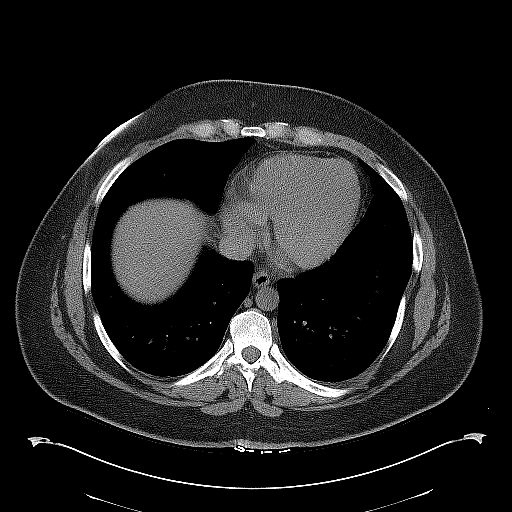
[im 19/24  lung]
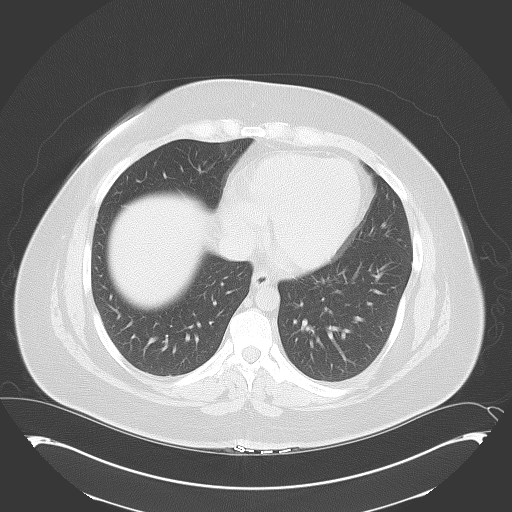

[Series 5: coronal · coronal · 0.86mm/px · 3 of 140 slices shown, 4 images]
[im 47/140  soft-tissue]
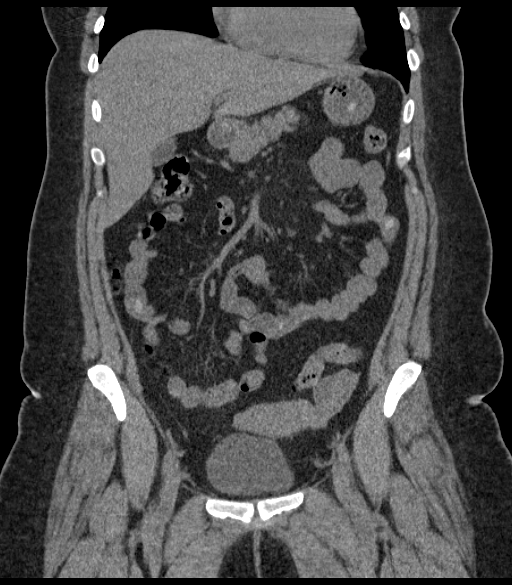
[im 62/140  soft-tissue]
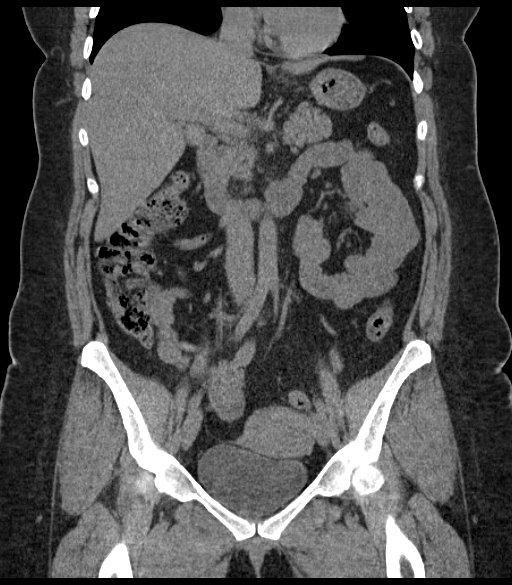
[im 62/140  bone]
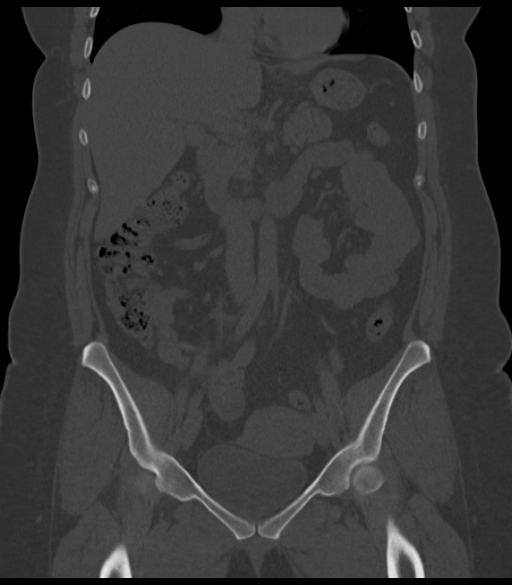
[im 78/140  soft-tissue]
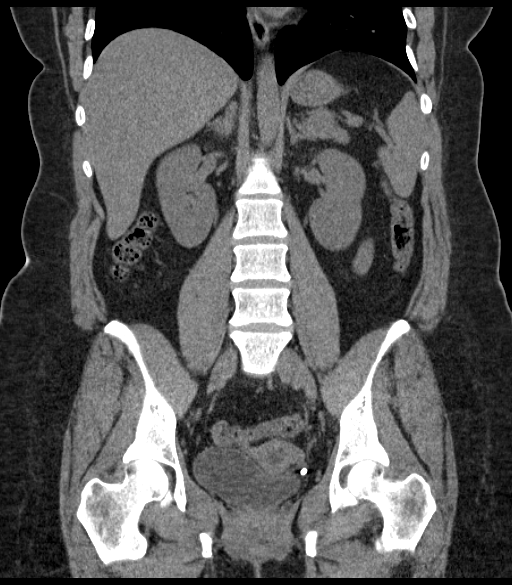

[Series 6: sagittal · sagittal · 0.72mm/px · 1 of 176 slices shown, 2 images]
[im 59/176  soft-tissue]
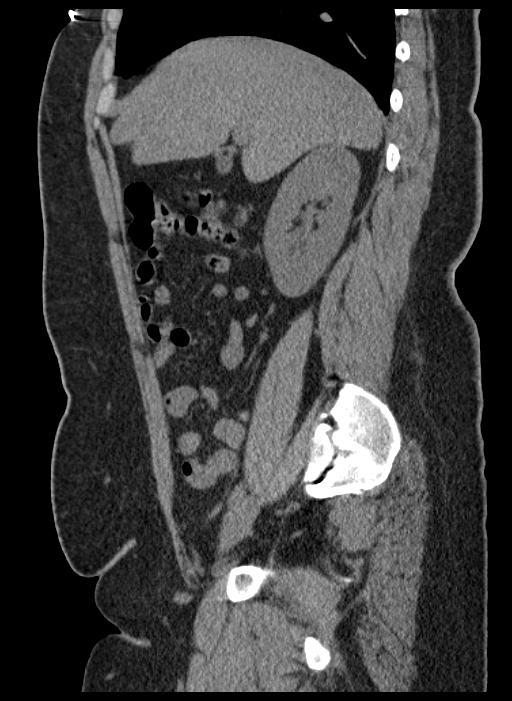
[im 59/176  bone]
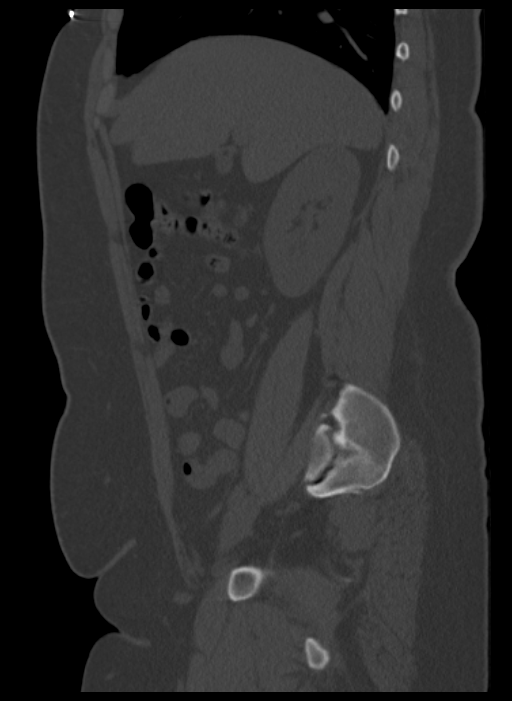

[8 of 46 positions shown; findings below may reference images not displayed]

FINDINGS: Lower chest:  No acute findings.

Hepatobiliary: No mass visualized on this un-enhanced exam.
Gallbladder is unremarkable.

Pancreas: No mass or inflammatory process identified on this
un-enhanced exam.

Spleen: Within normal limits in size.

Adrenals/Urinary Tract: No evidence of urolithiasis or
hydronephrosis. Unopacified urinary bladder is unremarkable in
appearance.

Stomach/Bowel: No evidence of obstruction, inflammatory process, or
abnormal fluid collections. Normal appendix visualized.

Vascular/Lymphatic: No pathologically enlarged lymph nodes. No
evidence of abdominal aortic aneurysm.

Reproductive: Uterus and ovaries are unremarkable. No mass or other
significant abnormality.

Other: None.

Musculoskeletal:  No suspicious bone lesions identified.
IMPRESSION: No acute findings or other significant abnormality identified.

## 2017-05-22 DIAGNOSIS — E538 Deficiency of other specified B group vitamins: Secondary | ICD-10-CM | POA: Diagnosis not present

## 2017-05-22 DIAGNOSIS — R51 Headache: Secondary | ICD-10-CM | POA: Diagnosis not present

## 2017-06-26 DIAGNOSIS — E538 Deficiency of other specified B group vitamins: Secondary | ICD-10-CM | POA: Diagnosis not present

## 2017-07-05 DIAGNOSIS — E538 Deficiency of other specified B group vitamins: Secondary | ICD-10-CM | POA: Diagnosis not present

## 2017-07-05 DIAGNOSIS — Z0000 Encounter for general adult medical examination without abnormal findings: Secondary | ICD-10-CM | POA: Diagnosis not present

## 2017-07-10 DIAGNOSIS — G4733 Obstructive sleep apnea (adult) (pediatric): Secondary | ICD-10-CM | POA: Diagnosis not present

## 2017-07-10 DIAGNOSIS — Z0000 Encounter for general adult medical examination without abnormal findings: Secondary | ICD-10-CM | POA: Diagnosis not present

## 2017-07-31 DIAGNOSIS — E538 Deficiency of other specified B group vitamins: Secondary | ICD-10-CM | POA: Diagnosis not present

## 2017-09-17 DIAGNOSIS — E538 Deficiency of other specified B group vitamins: Secondary | ICD-10-CM | POA: Diagnosis not present

## 2017-09-17 DIAGNOSIS — R51 Headache: Secondary | ICD-10-CM | POA: Diagnosis not present

## 2017-10-31 DIAGNOSIS — E538 Deficiency of other specified B group vitamins: Secondary | ICD-10-CM | POA: Diagnosis not present

## 2017-11-26 DIAGNOSIS — R51 Headache: Secondary | ICD-10-CM | POA: Diagnosis not present

## 2017-11-29 ENCOUNTER — Other Ambulatory Visit: Payer: Self-pay | Admitting: Neurology

## 2017-11-29 DIAGNOSIS — R51 Headache: Principal | ICD-10-CM

## 2017-11-29 DIAGNOSIS — R519 Headache, unspecified: Secondary | ICD-10-CM

## 2017-12-12 ENCOUNTER — Ambulatory Visit
Admission: RE | Admit: 2017-12-12 | Discharge: 2017-12-12 | Disposition: A | Discharge status: Home / Self Care | Payer: 59 | Source: Ambulatory Visit | Attending: Neurology | Admitting: Neurology

## 2017-12-12 DIAGNOSIS — R51 Headache: Secondary | ICD-10-CM | POA: Insufficient documentation

## 2017-12-12 DIAGNOSIS — R519 Headache, unspecified: Secondary | ICD-10-CM

## 2017-12-12 DIAGNOSIS — E538 Deficiency of other specified B group vitamins: Secondary | ICD-10-CM | POA: Diagnosis not present

## 2017-12-12 DIAGNOSIS — G44219 Episodic tension-type headache, not intractable: Secondary | ICD-10-CM | POA: Diagnosis not present

## 2018-01-06 DIAGNOSIS — Z6841 Body Mass Index (BMI) 40.0 and over, adult: Secondary | ICD-10-CM | POA: Diagnosis not present

## 2018-01-06 DIAGNOSIS — Z01419 Encounter for gynecological examination (general) (routine) without abnormal findings: Secondary | ICD-10-CM | POA: Diagnosis not present

## 2018-01-15 DIAGNOSIS — Z23 Encounter for immunization: Secondary | ICD-10-CM | POA: Diagnosis not present

## 2018-01-24 DIAGNOSIS — E538 Deficiency of other specified B group vitamins: Secondary | ICD-10-CM | POA: Diagnosis not present

## 2018-02-11 DIAGNOSIS — R51 Headache: Secondary | ICD-10-CM | POA: Diagnosis not present

## 2018-03-11 DIAGNOSIS — E538 Deficiency of other specified B group vitamins: Secondary | ICD-10-CM | POA: Diagnosis not present

## 2018-04-22 DIAGNOSIS — E539 Vitamin B deficiency, unspecified: Secondary | ICD-10-CM | POA: Diagnosis not present

## 2018-04-22 DIAGNOSIS — E538 Deficiency of other specified B group vitamins: Secondary | ICD-10-CM | POA: Diagnosis not present

## 2018-06-03 DIAGNOSIS — E538 Deficiency of other specified B group vitamins: Secondary | ICD-10-CM | POA: Diagnosis not present

## 2018-06-03 DIAGNOSIS — E539 Vitamin B deficiency, unspecified: Secondary | ICD-10-CM | POA: Diagnosis not present
